# Patient Record
Sex: Female | Born: 1955
Health system: Southern US, Community
[De-identification: ages and names within clinical notes are randomized; demographics above are authoritative.]

## PROBLEM LIST (undated history)

## (undated) DIAGNOSIS — F79 Unspecified intellectual disabilities: Secondary | ICD-10-CM

## (undated) DIAGNOSIS — I5032 Chronic diastolic (congestive) heart failure: Secondary | ICD-10-CM

## (undated) DIAGNOSIS — I10 Essential (primary) hypertension: Secondary | ICD-10-CM

## (undated) DIAGNOSIS — N183 Chronic kidney disease, stage 3 unspecified: Secondary | ICD-10-CM

## (undated) DIAGNOSIS — I2510 Atherosclerotic heart disease of native coronary artery without angina pectoris: Secondary | ICD-10-CM

## (undated) DIAGNOSIS — I4510 Unspecified right bundle-branch block: Secondary | ICD-10-CM

## (undated) DIAGNOSIS — E785 Hyperlipidemia, unspecified: Secondary | ICD-10-CM

## (undated) HISTORY — DX: Atherosclerotic heart disease of native coronary artery without angina pectoris: I25.10

## (undated) HISTORY — DX: Chronic kidney disease, stage 3 unspecified: N18.30

## (undated) HISTORY — DX: Unspecified right bundle-branch block: I45.10

## (undated) HISTORY — DX: Morbid (severe) obesity due to excess calories: E66.01

## (undated) HISTORY — DX: Chronic diastolic (congestive) heart failure: I50.32

---

## 1998-03-15 ENCOUNTER — Emergency Department (HOSPITAL_COMMUNITY): Admission: EM | Admit: 1998-03-15 | Discharge: 1998-03-15 | Payer: Self-pay | Admitting: Emergency Medicine

## 1999-02-25 ENCOUNTER — Encounter: Payer: Self-pay | Admitting: *Deleted

## 1999-02-25 ENCOUNTER — Emergency Department (HOSPITAL_COMMUNITY): Admission: EM | Admit: 1999-02-25 | Discharge: 1999-02-25 | Payer: Self-pay | Admitting: *Deleted

## 1999-04-28 ENCOUNTER — Encounter: Admission: RE | Admit: 1999-04-28 | Discharge: 1999-07-06 | Payer: Self-pay | Admitting: Orthopedic Surgery

## 2000-08-30 ENCOUNTER — Emergency Department (HOSPITAL_COMMUNITY): Admission: EM | Admit: 2000-08-30 | Discharge: 2000-08-30 | Payer: Self-pay | Admitting: Emergency Medicine

## 2001-06-27 ENCOUNTER — Encounter: Admission: RE | Admit: 2001-06-27 | Discharge: 2001-06-27 | Payer: Self-pay | Admitting: Obstetrics & Gynecology

## 2001-06-27 ENCOUNTER — Other Ambulatory Visit: Admission: RE | Admit: 2001-06-27 | Discharge: 2001-06-27 | Payer: Self-pay | Admitting: Obstetrics & Gynecology

## 2001-08-08 ENCOUNTER — Encounter: Admission: RE | Admit: 2001-08-08 | Discharge: 2001-08-08 | Payer: Self-pay | Admitting: Obstetrics & Gynecology

## 2002-10-17 ENCOUNTER — Ambulatory Visit (HOSPITAL_COMMUNITY): Admission: RE | Admit: 2002-10-17 | Discharge: 2002-10-17 | Payer: Self-pay | Admitting: *Deleted

## 2002-10-24 ENCOUNTER — Encounter: Payer: Self-pay | Admitting: *Deleted

## 2002-10-24 ENCOUNTER — Encounter: Admission: RE | Admit: 2002-10-24 | Discharge: 2002-10-24 | Payer: Self-pay | Admitting: *Deleted

## 2003-10-29 ENCOUNTER — Encounter: Admission: RE | Admit: 2003-10-29 | Discharge: 2003-10-29 | Payer: Self-pay | Admitting: *Deleted

## 2007-07-20 ENCOUNTER — Emergency Department (HOSPITAL_COMMUNITY): Admission: EM | Admit: 2007-07-20 | Discharge: 2007-07-21 | Payer: Self-pay | Admitting: Emergency Medicine

## 2009-03-14 ENCOUNTER — Encounter: Admission: RE | Admit: 2009-03-14 | Discharge: 2009-03-14 | Payer: Self-pay | Admitting: Internal Medicine

## 2009-03-21 ENCOUNTER — Encounter: Admission: RE | Admit: 2009-03-21 | Discharge: 2009-03-21 | Payer: Self-pay | Admitting: Internal Medicine

## 2010-01-01 ENCOUNTER — Emergency Department (HOSPITAL_COMMUNITY): Admission: EM | Admit: 2010-01-01 | Discharge: 2010-01-01 | Payer: Self-pay | Admitting: Family Medicine

## 2010-05-15 ENCOUNTER — Ambulatory Visit (HOSPITAL_COMMUNITY): Admission: RE | Admit: 2010-05-15 | Discharge: 2010-05-15 | Payer: Self-pay | Admitting: Family Medicine

## 2011-01-03 ENCOUNTER — Encounter: Payer: Self-pay | Admitting: Internal Medicine

## 2013-02-21 ENCOUNTER — Other Ambulatory Visit: Payer: Self-pay | Admitting: Otolaryngology

## 2013-02-21 DIAGNOSIS — H6980 Other specified disorders of Eustachian tube, unspecified ear: Secondary | ICD-10-CM

## 2013-03-13 ENCOUNTER — Ambulatory Visit
Admission: RE | Admit: 2013-03-13 | Discharge: 2013-03-13 | Disposition: A | Discharge status: Home / Self Care | Payer: Medicare PPO | Source: Ambulatory Visit | Attending: Otolaryngology | Admitting: Otolaryngology

## 2013-03-13 DIAGNOSIS — H6980 Other specified disorders of Eustachian tube, unspecified ear: Secondary | ICD-10-CM

## 2013-03-13 DIAGNOSIS — H6690 Otitis media, unspecified, unspecified ear: Secondary | ICD-10-CM

## 2013-03-13 DIAGNOSIS — H9190 Unspecified hearing loss, unspecified ear: Secondary | ICD-10-CM

## 2013-11-13 IMAGING — CT CT TEMPORAL BONES W/O CM
4 of 6 series · 18 of 30 positions shown, 19 images · non-contrast
Comparison: None.

CLINICAL DATA: Right-sided tinnitus, with itching and burning of
the right ear.

CT TEMPORAL BONES WITHOUT CONTRAST
TECHNIQUE: Axial and coronal plane CT imaging of the petrous
temporal bones was performed with thin-collimation image
reconstruction.  No intravenous contrast was administered.
Multiplanar CT image reconstructions were also generated.

[Series 4: ax mag left · axial · 0.20mm/px · z∈[-18,+5]mm · 4 of 127 slices shown]
[im 26/127  bone]
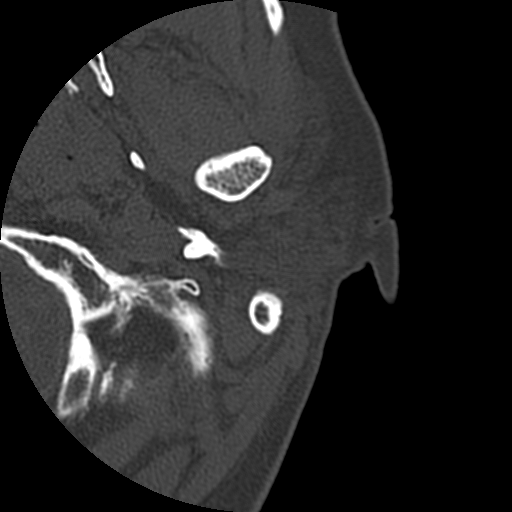
[im 51/127  bone]
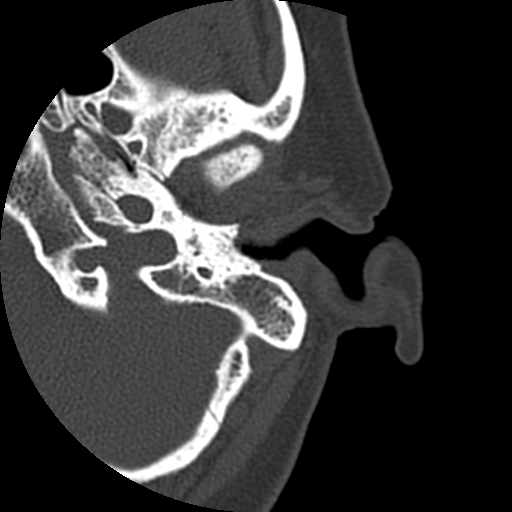
[im 76/127  bone]
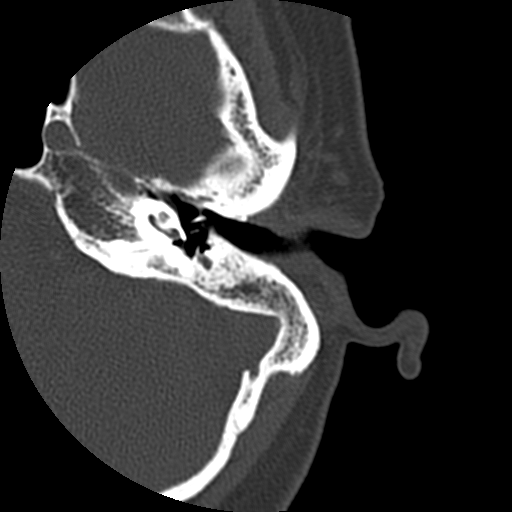
[im 101/127  bone]
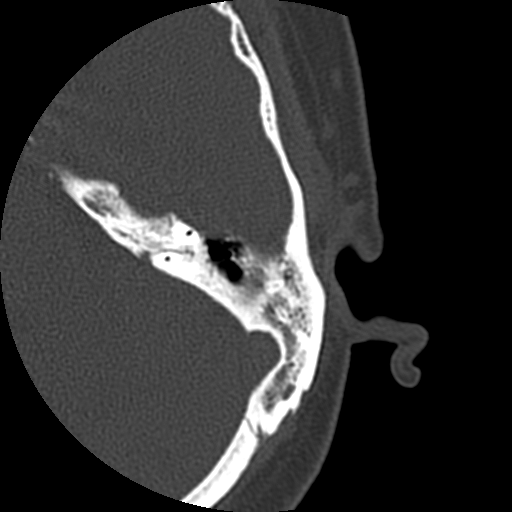

[Series 200: cor bone · axial · 0.33mm/px · z∈[-6,+79]mm · 3 of 70 slices shown, 4 images]
[im 1/70  brain]
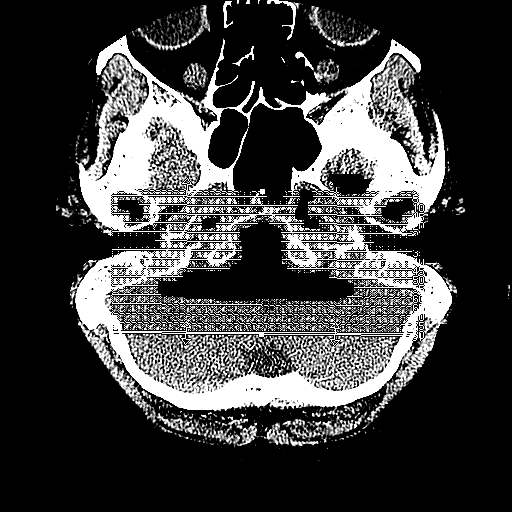
[im 1/70  bone]
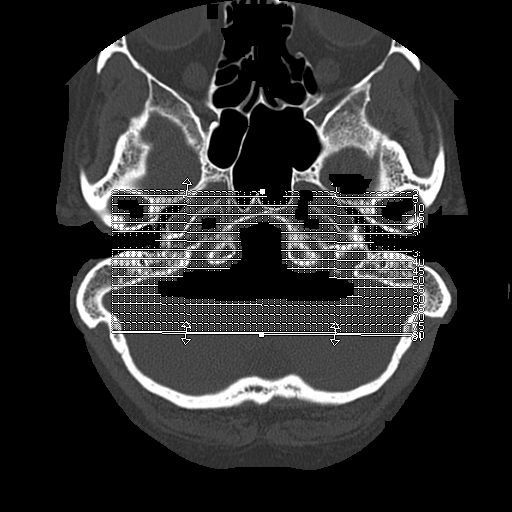
[im 35/70  bone]
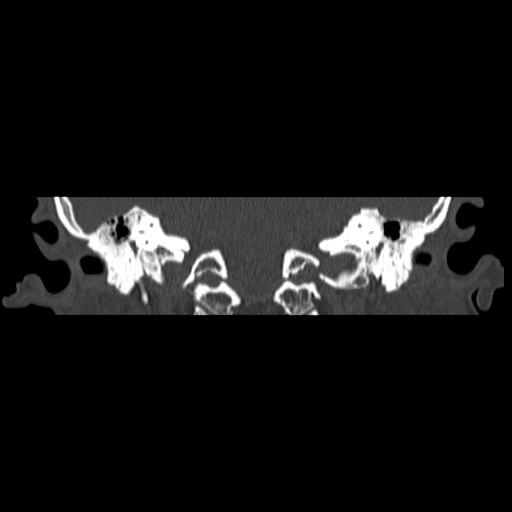
[im 70/70  bone]
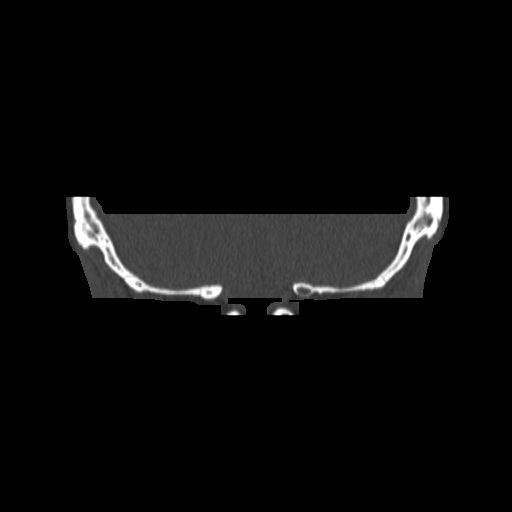

[Series 300: cor mag right · coronal · 0.20mm/px · 6 of 177 slices shown]
[im 26/177  bone]
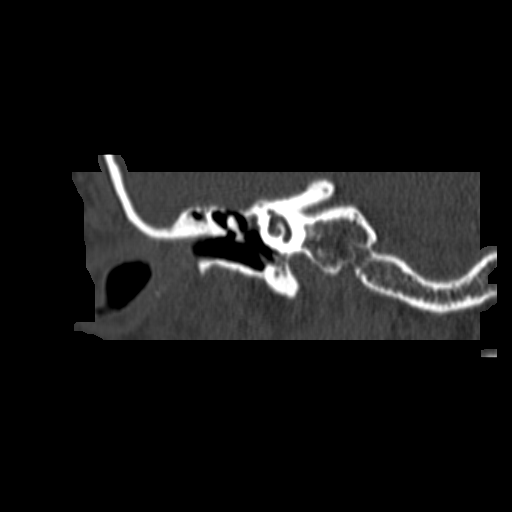
[im 51/177  bone]
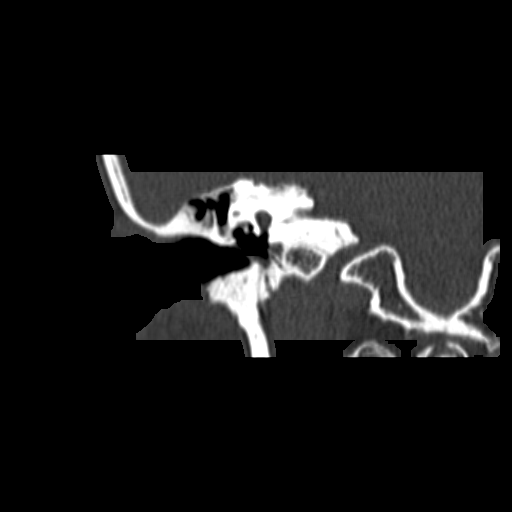
[im 76/177  bone]
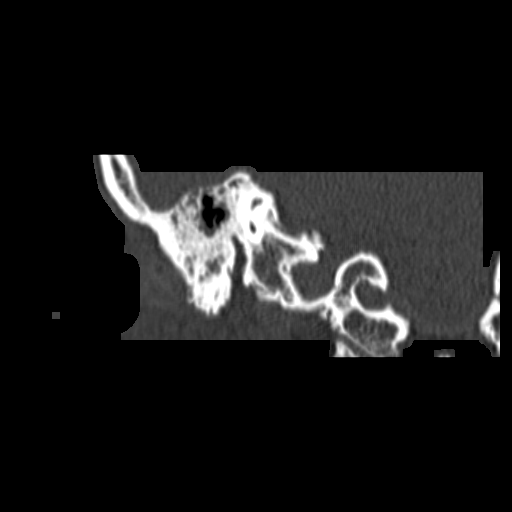
[im 101/177  bone]
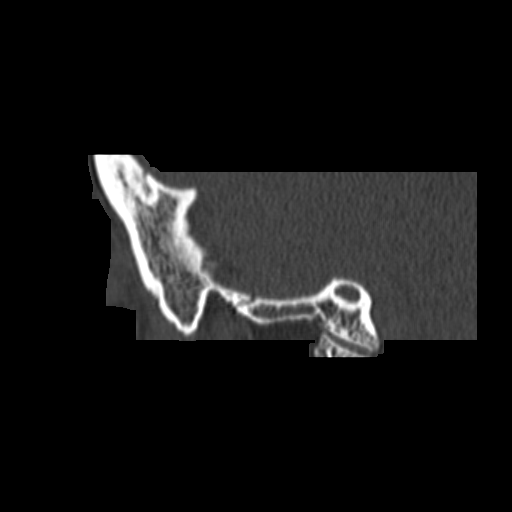
[im 126/177  bone]
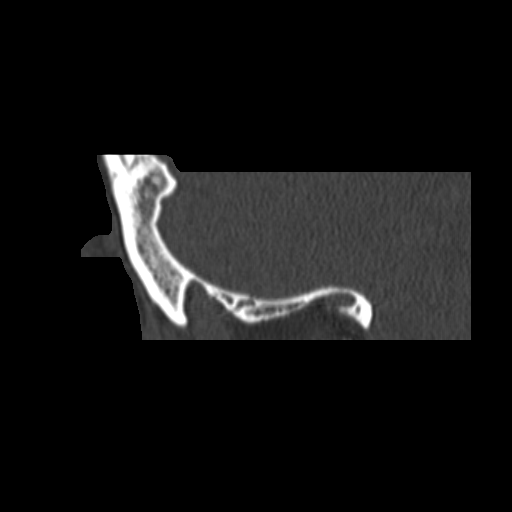
[im 151/177  bone]
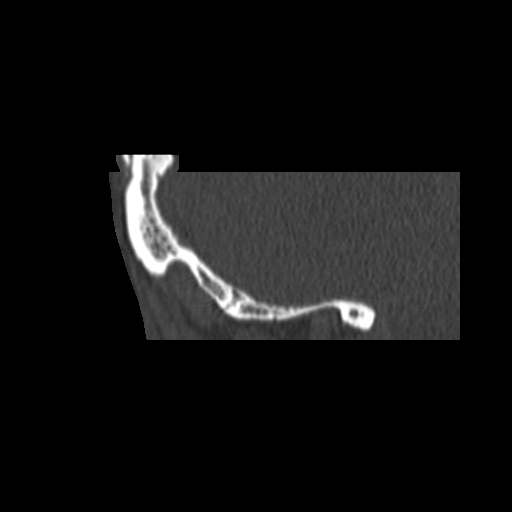

[Series 400: cor mag left · coronal · 0.20mm/px · 5 of 149 slices shown]
[im 25/149  bone]
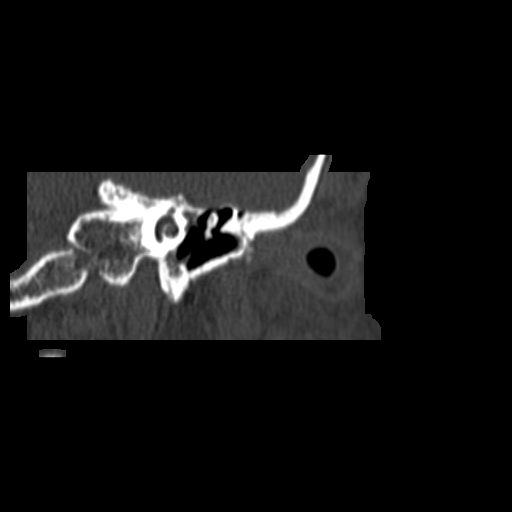
[im 50/149  bone]
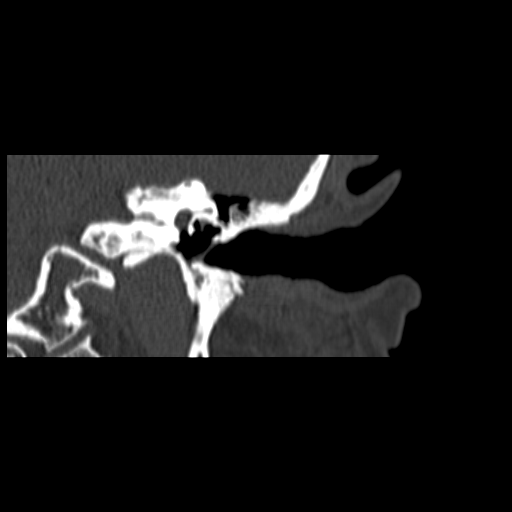
[im 75/149  bone]
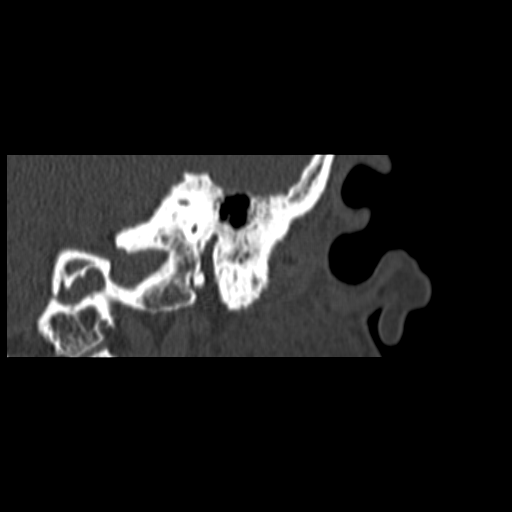
[im 99/149  bone]
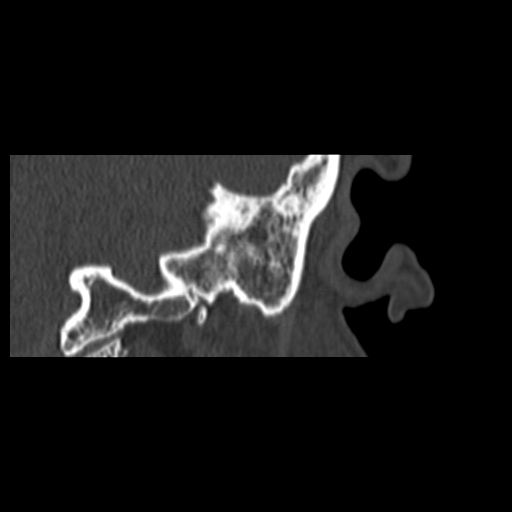
[im 124/149  bone]
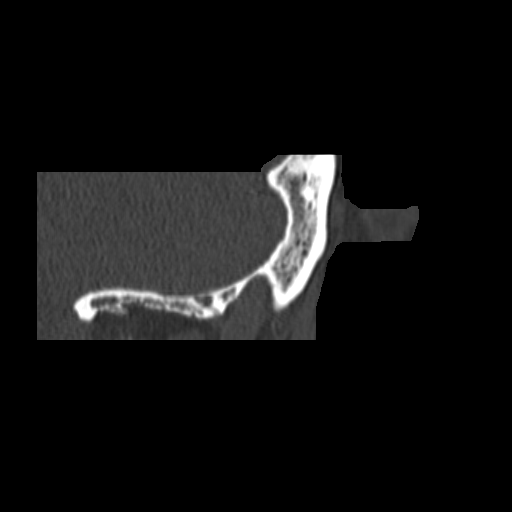

[18 of 30 positions shown; findings below may reference images not displayed]

FINDINGS: Both external canals are widely patent.  Normal tympanic
membranes bilaterally.  Normal right and left ossicles. No middle
ear fluid or mass.

Both mastoids are poorly aerated likely on a congenital basis.
Slight dependent fluid in the right and left mastoid aditus ad
antrum without coalescence or bony destruction.  Normal cochlea,
vestibule, semicircular canals, and internal auditory canals.
Visualized intracranial compartment unremarkable.  No visible
paranasal sinus disease.  Grossly negative posterior orbits.
IMPRESSION: Congenital hypoaeration of both mastoids.  No significant right or
left sided external, middle, or inner ear pathology.

## 2015-05-26 DIAGNOSIS — I10 Essential (primary) hypertension: Secondary | ICD-10-CM | POA: Diagnosis not present

## 2015-05-26 DIAGNOSIS — E785 Hyperlipidemia, unspecified: Secondary | ICD-10-CM | POA: Diagnosis not present

## 2015-08-25 DIAGNOSIS — I10 Essential (primary) hypertension: Secondary | ICD-10-CM | POA: Diagnosis not present

## 2015-08-25 DIAGNOSIS — F17200 Nicotine dependence, unspecified, uncomplicated: Secondary | ICD-10-CM | POA: Diagnosis not present

## 2015-12-09 DIAGNOSIS — Z23 Encounter for immunization: Secondary | ICD-10-CM | POA: Diagnosis not present

## 2015-12-09 DIAGNOSIS — H9311 Tinnitus, right ear: Secondary | ICD-10-CM | POA: Diagnosis not present

## 2015-12-09 DIAGNOSIS — I10 Essential (primary) hypertension: Secondary | ICD-10-CM | POA: Diagnosis not present

## 2015-12-09 DIAGNOSIS — Z6831 Body mass index (BMI) 31.0-31.9, adult: Secondary | ICD-10-CM | POA: Diagnosis not present

## 2015-12-09 DIAGNOSIS — H9319 Tinnitus, unspecified ear: Secondary | ICD-10-CM | POA: Diagnosis not present

## 2016-06-23 DIAGNOSIS — I10 Essential (primary) hypertension: Secondary | ICD-10-CM | POA: Diagnosis not present

## 2016-06-23 DIAGNOSIS — Z0000 Encounter for general adult medical examination without abnormal findings: Secondary | ICD-10-CM | POA: Diagnosis not present

## 2016-06-23 DIAGNOSIS — E785 Hyperlipidemia, unspecified: Secondary | ICD-10-CM | POA: Diagnosis not present

## 2016-06-23 DIAGNOSIS — N390 Urinary tract infection, site not specified: Secondary | ICD-10-CM | POA: Diagnosis not present

## 2016-10-27 DIAGNOSIS — E785 Hyperlipidemia, unspecified: Secondary | ICD-10-CM | POA: Diagnosis not present

## 2016-10-27 DIAGNOSIS — M1990 Unspecified osteoarthritis, unspecified site: Secondary | ICD-10-CM | POA: Diagnosis not present

## 2016-10-27 DIAGNOSIS — Z23 Encounter for immunization: Secondary | ICD-10-CM | POA: Diagnosis not present

## 2016-10-27 DIAGNOSIS — I10 Essential (primary) hypertension: Secondary | ICD-10-CM | POA: Diagnosis not present

## 2017-01-14 DIAGNOSIS — S30820A Blister (nonthermal) of lower back and pelvis, initial encounter: Secondary | ICD-10-CM | POA: Diagnosis not present

## 2017-02-21 DIAGNOSIS — Z0000 Encounter for general adult medical examination without abnormal findings: Secondary | ICD-10-CM | POA: Diagnosis not present

## 2017-02-21 DIAGNOSIS — I10 Essential (primary) hypertension: Secondary | ICD-10-CM | POA: Diagnosis not present

## 2017-02-21 DIAGNOSIS — F17210 Nicotine dependence, cigarettes, uncomplicated: Secondary | ICD-10-CM | POA: Diagnosis not present

## 2017-05-25 DIAGNOSIS — I10 Essential (primary) hypertension: Secondary | ICD-10-CM | POA: Diagnosis not present

## 2017-05-25 DIAGNOSIS — R05 Cough: Secondary | ICD-10-CM | POA: Diagnosis not present

## 2017-05-25 DIAGNOSIS — E785 Hyperlipidemia, unspecified: Secondary | ICD-10-CM | POA: Diagnosis not present

## 2017-05-25 DIAGNOSIS — Z634 Disappearance and death of family member: Secondary | ICD-10-CM | POA: Diagnosis not present

## 2017-10-31 ENCOUNTER — Other Ambulatory Visit (HOSPITAL_COMMUNITY)
Admission: RE | Admit: 2017-10-31 | Discharge: 2017-10-31 | Disposition: A | Discharge status: Home / Self Care | Payer: Medicare HMO | Source: Ambulatory Visit | Attending: Family Medicine | Admitting: Family Medicine

## 2017-10-31 ENCOUNTER — Other Ambulatory Visit: Payer: Self-pay | Admitting: Family Medicine

## 2017-10-31 DIAGNOSIS — N760 Acute vaginitis: Secondary | ICD-10-CM | POA: Diagnosis not present

## 2017-10-31 DIAGNOSIS — I10 Essential (primary) hypertension: Secondary | ICD-10-CM | POA: Diagnosis not present

## 2017-10-31 DIAGNOSIS — Z23 Encounter for immunization: Secondary | ICD-10-CM | POA: Diagnosis not present

## 2017-10-31 DIAGNOSIS — E785 Hyperlipidemia, unspecified: Secondary | ICD-10-CM | POA: Diagnosis not present

## 2017-10-31 DIAGNOSIS — Z720 Tobacco use: Secondary | ICD-10-CM | POA: Diagnosis not present

## 2017-11-02 LAB — URINE CYTOLOGY ANCILLARY ONLY
Bacterial vaginitis: NEGATIVE
CANDIDA VAGINITIS: NEGATIVE
CHLAMYDIA, DNA PROBE: NEGATIVE
Neisseria Gonorrhea: NEGATIVE
Trichomonas: NEGATIVE

## 2017-11-09 DIAGNOSIS — I10 Essential (primary) hypertension: Secondary | ICD-10-CM | POA: Diagnosis not present

## 2017-11-09 DIAGNOSIS — F17200 Nicotine dependence, unspecified, uncomplicated: Secondary | ICD-10-CM | POA: Diagnosis not present

## 2017-11-09 DIAGNOSIS — F320 Major depressive disorder, single episode, mild: Secondary | ICD-10-CM | POA: Diagnosis not present

## 2017-11-09 DIAGNOSIS — F70 Mild intellectual disabilities: Secondary | ICD-10-CM | POA: Diagnosis not present

## 2018-03-01 DIAGNOSIS — R140 Abdominal distension (gaseous): Secondary | ICD-10-CM | POA: Diagnosis not present

## 2018-03-01 DIAGNOSIS — R1084 Generalized abdominal pain: Secondary | ICD-10-CM | POA: Diagnosis not present

## 2018-03-02 DIAGNOSIS — R079 Chest pain, unspecified: Secondary | ICD-10-CM | POA: Diagnosis not present

## 2018-03-02 DIAGNOSIS — R140 Abdominal distension (gaseous): Secondary | ICD-10-CM | POA: Diagnosis not present

## 2018-04-12 DIAGNOSIS — I214 Non-ST elevation (NSTEMI) myocardial infarction: Secondary | ICD-10-CM

## 2018-04-12 HISTORY — DX: Non-ST elevation (NSTEMI) myocardial infarction: I21.4

## 2018-04-12 HISTORY — PX: CORONARY ANGIOPLASTY WITH STENT PLACEMENT: SHX49

## 2018-04-17 DIAGNOSIS — R61 Generalized hyperhidrosis: Secondary | ICD-10-CM | POA: Diagnosis not present

## 2018-04-17 DIAGNOSIS — I10 Essential (primary) hypertension: Secondary | ICD-10-CM | POA: Diagnosis not present

## 2018-04-17 DIAGNOSIS — I25118 Atherosclerotic heart disease of native coronary artery with other forms of angina pectoris: Secondary | ICD-10-CM | POA: Diagnosis not present

## 2018-04-17 DIAGNOSIS — Z7901 Long term (current) use of anticoagulants: Secondary | ICD-10-CM | POA: Diagnosis not present

## 2018-04-17 DIAGNOSIS — Z7401 Bed confinement status: Secondary | ICD-10-CM | POA: Diagnosis not present

## 2018-04-17 DIAGNOSIS — I2510 Atherosclerotic heart disease of native coronary artery without angina pectoris: Secondary | ICD-10-CM | POA: Diagnosis not present

## 2018-04-17 DIAGNOSIS — E785 Hyperlipidemia, unspecified: Secondary | ICD-10-CM | POA: Diagnosis not present

## 2018-04-17 DIAGNOSIS — R748 Abnormal levels of other serum enzymes: Secondary | ICD-10-CM | POA: Diagnosis not present

## 2018-04-17 DIAGNOSIS — R0602 Shortness of breath: Secondary | ICD-10-CM | POA: Diagnosis not present

## 2018-04-17 DIAGNOSIS — Z79899 Other long term (current) drug therapy: Secondary | ICD-10-CM | POA: Diagnosis not present

## 2018-04-17 DIAGNOSIS — R079 Chest pain, unspecified: Secondary | ICD-10-CM | POA: Diagnosis not present

## 2018-04-17 DIAGNOSIS — R112 Nausea with vomiting, unspecified: Secondary | ICD-10-CM | POA: Diagnosis not present

## 2018-04-17 DIAGNOSIS — E7849 Other hyperlipidemia: Secondary | ICD-10-CM | POA: Diagnosis not present

## 2018-04-17 DIAGNOSIS — D72829 Elevated white blood cell count, unspecified: Secondary | ICD-10-CM | POA: Diagnosis not present

## 2018-04-17 DIAGNOSIS — R0789 Other chest pain: Secondary | ICD-10-CM | POA: Diagnosis not present

## 2018-04-17 DIAGNOSIS — F0390 Unspecified dementia without behavioral disturbance: Secondary | ICD-10-CM | POA: Diagnosis not present

## 2018-04-17 DIAGNOSIS — I2119 ST elevation (STEMI) myocardial infarction involving other coronary artery of inferior wall: Secondary | ICD-10-CM | POA: Diagnosis not present

## 2018-04-17 DIAGNOSIS — M2550 Pain in unspecified joint: Secondary | ICD-10-CM | POA: Diagnosis not present

## 2018-04-17 DIAGNOSIS — I214 Non-ST elevation (NSTEMI) myocardial infarction: Secondary | ICD-10-CM | POA: Diagnosis not present

## 2018-04-17 DIAGNOSIS — I4510 Unspecified right bundle-branch block: Secondary | ICD-10-CM | POA: Diagnosis not present

## 2018-04-17 DIAGNOSIS — Z7982 Long term (current) use of aspirin: Secondary | ICD-10-CM | POA: Diagnosis not present

## 2018-04-26 DIAGNOSIS — F17200 Nicotine dependence, unspecified, uncomplicated: Secondary | ICD-10-CM | POA: Diagnosis not present

## 2018-04-26 DIAGNOSIS — R0789 Other chest pain: Secondary | ICD-10-CM | POA: Diagnosis not present

## 2018-04-26 DIAGNOSIS — I214 Non-ST elevation (NSTEMI) myocardial infarction: Secondary | ICD-10-CM | POA: Diagnosis not present

## 2018-04-26 DIAGNOSIS — E7849 Other hyperlipidemia: Secondary | ICD-10-CM | POA: Diagnosis not present

## 2018-06-28 DIAGNOSIS — I214 Non-ST elevation (NSTEMI) myocardial infarction: Secondary | ICD-10-CM | POA: Diagnosis not present

## 2018-06-28 DIAGNOSIS — K089 Disorder of teeth and supporting structures, unspecified: Secondary | ICD-10-CM | POA: Diagnosis not present

## 2018-06-30 DIAGNOSIS — E782 Mixed hyperlipidemia: Secondary | ICD-10-CM | POA: Diagnosis not present

## 2018-06-30 DIAGNOSIS — I4510 Unspecified right bundle-branch block: Secondary | ICD-10-CM | POA: Diagnosis not present

## 2018-06-30 DIAGNOSIS — E785 Hyperlipidemia, unspecified: Secondary | ICD-10-CM | POA: Diagnosis not present

## 2018-06-30 DIAGNOSIS — Z7982 Long term (current) use of aspirin: Secondary | ICD-10-CM | POA: Diagnosis not present

## 2018-06-30 DIAGNOSIS — F0280 Dementia in other diseases classified elsewhere without behavioral disturbance: Secondary | ICD-10-CM | POA: Diagnosis not present

## 2018-06-30 DIAGNOSIS — G301 Alzheimer's disease with late onset: Secondary | ICD-10-CM | POA: Diagnosis not present

## 2018-06-30 DIAGNOSIS — I493 Ventricular premature depolarization: Secondary | ICD-10-CM | POA: Diagnosis not present

## 2018-06-30 DIAGNOSIS — I10 Essential (primary) hypertension: Secondary | ICD-10-CM | POA: Diagnosis not present

## 2018-06-30 DIAGNOSIS — I214 Non-ST elevation (NSTEMI) myocardial infarction: Secondary | ICD-10-CM | POA: Diagnosis not present

## 2018-06-30 DIAGNOSIS — I25768 Atherosclerosis of bypass graft of coronary artery of transplanted heart with other forms of angina pectoris: Secondary | ICD-10-CM | POA: Diagnosis not present

## 2018-07-02 DIAGNOSIS — I493 Ventricular premature depolarization: Secondary | ICD-10-CM | POA: Diagnosis not present

## 2018-07-02 DIAGNOSIS — I4510 Unspecified right bundle-branch block: Secondary | ICD-10-CM | POA: Diagnosis not present

## 2018-07-03 DIAGNOSIS — F70 Mild intellectual disabilities: Secondary | ICD-10-CM | POA: Diagnosis not present

## 2018-07-03 DIAGNOSIS — M6281 Muscle weakness (generalized): Secondary | ICD-10-CM | POA: Diagnosis not present

## 2018-07-03 DIAGNOSIS — I10 Essential (primary) hypertension: Secondary | ICD-10-CM | POA: Diagnosis not present

## 2018-07-10 DIAGNOSIS — M6281 Muscle weakness (generalized): Secondary | ICD-10-CM | POA: Diagnosis not present

## 2018-07-10 DIAGNOSIS — F70 Mild intellectual disabilities: Secondary | ICD-10-CM | POA: Diagnosis not present

## 2018-07-10 DIAGNOSIS — I10 Essential (primary) hypertension: Secondary | ICD-10-CM | POA: Diagnosis not present

## 2018-07-12 DIAGNOSIS — M6281 Muscle weakness (generalized): Secondary | ICD-10-CM | POA: Diagnosis not present

## 2018-07-12 DIAGNOSIS — I10 Essential (primary) hypertension: Secondary | ICD-10-CM | POA: Diagnosis not present

## 2018-07-12 DIAGNOSIS — F70 Mild intellectual disabilities: Secondary | ICD-10-CM | POA: Diagnosis not present

## 2018-07-18 DIAGNOSIS — I10 Essential (primary) hypertension: Secondary | ICD-10-CM | POA: Diagnosis not present

## 2018-07-18 DIAGNOSIS — F70 Mild intellectual disabilities: Secondary | ICD-10-CM | POA: Diagnosis not present

## 2018-07-18 DIAGNOSIS — M6281 Muscle weakness (generalized): Secondary | ICD-10-CM | POA: Diagnosis not present

## 2018-07-20 DIAGNOSIS — F70 Mild intellectual disabilities: Secondary | ICD-10-CM | POA: Diagnosis not present

## 2018-07-20 DIAGNOSIS — M6281 Muscle weakness (generalized): Secondary | ICD-10-CM | POA: Diagnosis not present

## 2018-07-20 DIAGNOSIS — I10 Essential (primary) hypertension: Secondary | ICD-10-CM | POA: Diagnosis not present

## 2018-07-24 DIAGNOSIS — I10 Essential (primary) hypertension: Secondary | ICD-10-CM | POA: Diagnosis not present

## 2018-07-24 DIAGNOSIS — M6281 Muscle weakness (generalized): Secondary | ICD-10-CM | POA: Diagnosis not present

## 2018-07-24 DIAGNOSIS — F70 Mild intellectual disabilities: Secondary | ICD-10-CM | POA: Diagnosis not present

## 2018-07-27 DIAGNOSIS — M6281 Muscle weakness (generalized): Secondary | ICD-10-CM | POA: Diagnosis not present

## 2018-07-27 DIAGNOSIS — I10 Essential (primary) hypertension: Secondary | ICD-10-CM | POA: Diagnosis not present

## 2018-07-27 DIAGNOSIS — F70 Mild intellectual disabilities: Secondary | ICD-10-CM | POA: Diagnosis not present

## 2018-07-31 DIAGNOSIS — I10 Essential (primary) hypertension: Secondary | ICD-10-CM | POA: Diagnosis not present

## 2018-07-31 DIAGNOSIS — F70 Mild intellectual disabilities: Secondary | ICD-10-CM | POA: Diagnosis not present

## 2018-07-31 DIAGNOSIS — M6281 Muscle weakness (generalized): Secondary | ICD-10-CM | POA: Diagnosis not present

## 2018-08-02 DIAGNOSIS — F70 Mild intellectual disabilities: Secondary | ICD-10-CM | POA: Diagnosis not present

## 2018-08-02 DIAGNOSIS — I10 Essential (primary) hypertension: Secondary | ICD-10-CM | POA: Diagnosis not present

## 2018-08-02 DIAGNOSIS — M6281 Muscle weakness (generalized): Secondary | ICD-10-CM | POA: Diagnosis not present

## 2018-08-09 DIAGNOSIS — F70 Mild intellectual disabilities: Secondary | ICD-10-CM | POA: Diagnosis not present

## 2018-08-09 DIAGNOSIS — I10 Essential (primary) hypertension: Secondary | ICD-10-CM | POA: Diagnosis not present

## 2018-08-09 DIAGNOSIS — M6281 Muscle weakness (generalized): Secondary | ICD-10-CM | POA: Diagnosis not present

## 2018-08-10 DIAGNOSIS — F70 Mild intellectual disabilities: Secondary | ICD-10-CM | POA: Diagnosis not present

## 2018-08-10 DIAGNOSIS — I10 Essential (primary) hypertension: Secondary | ICD-10-CM | POA: Diagnosis not present

## 2018-08-10 DIAGNOSIS — M6281 Muscle weakness (generalized): Secondary | ICD-10-CM | POA: Diagnosis not present

## 2018-08-15 DIAGNOSIS — M6281 Muscle weakness (generalized): Secondary | ICD-10-CM | POA: Diagnosis not present

## 2018-08-15 DIAGNOSIS — F70 Mild intellectual disabilities: Secondary | ICD-10-CM | POA: Diagnosis not present

## 2018-08-15 DIAGNOSIS — I10 Essential (primary) hypertension: Secondary | ICD-10-CM | POA: Diagnosis not present

## 2018-08-17 DIAGNOSIS — M6281 Muscle weakness (generalized): Secondary | ICD-10-CM | POA: Diagnosis not present

## 2018-08-17 DIAGNOSIS — F70 Mild intellectual disabilities: Secondary | ICD-10-CM | POA: Diagnosis not present

## 2018-08-17 DIAGNOSIS — I10 Essential (primary) hypertension: Secondary | ICD-10-CM | POA: Diagnosis not present

## 2018-08-24 DIAGNOSIS — F70 Mild intellectual disabilities: Secondary | ICD-10-CM | POA: Diagnosis not present

## 2018-08-24 DIAGNOSIS — I10 Essential (primary) hypertension: Secondary | ICD-10-CM | POA: Diagnosis not present

## 2018-08-24 DIAGNOSIS — M6281 Muscle weakness (generalized): Secondary | ICD-10-CM | POA: Diagnosis not present

## 2018-08-25 DIAGNOSIS — M6281 Muscle weakness (generalized): Secondary | ICD-10-CM | POA: Diagnosis not present

## 2018-08-25 DIAGNOSIS — F70 Mild intellectual disabilities: Secondary | ICD-10-CM | POA: Diagnosis not present

## 2018-08-25 DIAGNOSIS — I10 Essential (primary) hypertension: Secondary | ICD-10-CM | POA: Diagnosis not present

## 2018-08-28 DIAGNOSIS — I10 Essential (primary) hypertension: Secondary | ICD-10-CM | POA: Diagnosis not present

## 2018-08-28 DIAGNOSIS — F70 Mild intellectual disabilities: Secondary | ICD-10-CM | POA: Diagnosis not present

## 2018-08-28 DIAGNOSIS — M6281 Muscle weakness (generalized): Secondary | ICD-10-CM | POA: Diagnosis not present

## 2018-10-25 DIAGNOSIS — L2089 Other atopic dermatitis: Secondary | ICD-10-CM | POA: Diagnosis not present

## 2018-10-25 DIAGNOSIS — Z789 Other specified health status: Secondary | ICD-10-CM | POA: Diagnosis not present

## 2018-10-25 DIAGNOSIS — Z716 Tobacco abuse counseling: Secondary | ICD-10-CM | POA: Diagnosis not present

## 2018-11-01 DIAGNOSIS — G8929 Other chronic pain: Secondary | ICD-10-CM | POA: Diagnosis not present

## 2018-11-01 DIAGNOSIS — Z789 Other specified health status: Secondary | ICD-10-CM | POA: Diagnosis not present

## 2018-11-29 DIAGNOSIS — R69 Illness, unspecified: Secondary | ICD-10-CM | POA: Diagnosis not present

## 2018-12-20 DIAGNOSIS — I10 Essential (primary) hypertension: Secondary | ICD-10-CM | POA: Diagnosis not present

## 2018-12-20 DIAGNOSIS — R05 Cough: Secondary | ICD-10-CM | POA: Diagnosis not present

## 2018-12-21 DIAGNOSIS — R05 Cough: Secondary | ICD-10-CM | POA: Diagnosis not present

## 2018-12-27 DIAGNOSIS — J060 Acute laryngopharyngitis: Secondary | ICD-10-CM | POA: Diagnosis not present

## 2019-01-04 DIAGNOSIS — H2513 Age-related nuclear cataract, bilateral: Secondary | ICD-10-CM | POA: Diagnosis not present

## 2019-03-21 DIAGNOSIS — B86 Scabies: Secondary | ICD-10-CM | POA: Diagnosis not present

## 2019-03-21 DIAGNOSIS — L298 Other pruritus: Secondary | ICD-10-CM | POA: Diagnosis not present

## 2019-04-03 DIAGNOSIS — I10 Essential (primary) hypertension: Secondary | ICD-10-CM | POA: Diagnosis not present

## 2019-04-03 DIAGNOSIS — E785 Hyperlipidemia, unspecified: Secondary | ICD-10-CM | POA: Diagnosis not present

## 2019-04-03 DIAGNOSIS — Z7982 Long term (current) use of aspirin: Secondary | ICD-10-CM | POA: Diagnosis not present

## 2019-04-03 DIAGNOSIS — I214 Non-ST elevation (NSTEMI) myocardial infarction: Secondary | ICD-10-CM | POA: Diagnosis not present

## 2019-04-03 DIAGNOSIS — I2510 Atherosclerotic heart disease of native coronary artery without angina pectoris: Secondary | ICD-10-CM | POA: Diagnosis not present

## 2020-08-26 ENCOUNTER — Emergency Department (HOSPITAL_COMMUNITY): Payer: Medicare HMO

## 2020-08-26 ENCOUNTER — Inpatient Hospital Stay (HOSPITAL_COMMUNITY)
Admission: EM | Admit: 2020-08-26 | Discharge: 2020-09-04 | DRG: 291 | Disposition: A | Discharge status: Home / Self Care | Payer: Medicare HMO | Attending: Family Medicine | Admitting: Family Medicine

## 2020-08-26 ENCOUNTER — Encounter (HOSPITAL_COMMUNITY): Payer: Self-pay

## 2020-08-26 ENCOUNTER — Other Ambulatory Visit: Payer: Self-pay

## 2020-08-26 DIAGNOSIS — E785 Hyperlipidemia, unspecified: Secondary | ICD-10-CM | POA: Diagnosis present

## 2020-08-26 DIAGNOSIS — I160 Hypertensive urgency: Secondary | ICD-10-CM | POA: Diagnosis present

## 2020-08-26 DIAGNOSIS — N1831 Chronic kidney disease, stage 3a: Secondary | ICD-10-CM | POA: Diagnosis present

## 2020-08-26 DIAGNOSIS — J810 Acute pulmonary edema: Secondary | ICD-10-CM

## 2020-08-26 DIAGNOSIS — F0390 Unspecified dementia without behavioral disturbance: Secondary | ICD-10-CM | POA: Diagnosis present

## 2020-08-26 DIAGNOSIS — J9600 Acute respiratory failure, unspecified whether with hypoxia or hypercapnia: Secondary | ICD-10-CM | POA: Diagnosis not present

## 2020-08-26 DIAGNOSIS — R809 Proteinuria, unspecified: Secondary | ICD-10-CM | POA: Diagnosis present

## 2020-08-26 DIAGNOSIS — J9601 Acute respiratory failure with hypoxia: Secondary | ICD-10-CM | POA: Diagnosis present

## 2020-08-26 DIAGNOSIS — I252 Old myocardial infarction: Secondary | ICD-10-CM | POA: Diagnosis not present

## 2020-08-26 DIAGNOSIS — I493 Ventricular premature depolarization: Secondary | ICD-10-CM | POA: Diagnosis present

## 2020-08-26 DIAGNOSIS — I200 Unstable angina: Secondary | ICD-10-CM | POA: Diagnosis present

## 2020-08-26 DIAGNOSIS — J189 Pneumonia, unspecified organism: Secondary | ICD-10-CM | POA: Diagnosis present

## 2020-08-26 DIAGNOSIS — E1122 Type 2 diabetes mellitus with diabetic chronic kidney disease: Secondary | ICD-10-CM | POA: Diagnosis present

## 2020-08-26 DIAGNOSIS — Z955 Presence of coronary angioplasty implant and graft: Secondary | ICD-10-CM | POA: Diagnosis not present

## 2020-08-26 DIAGNOSIS — Z6841 Body Mass Index (BMI) 40.0 and over, adult: Secondary | ICD-10-CM | POA: Diagnosis not present

## 2020-08-26 DIAGNOSIS — E1165 Type 2 diabetes mellitus with hyperglycemia: Secondary | ICD-10-CM | POA: Diagnosis present

## 2020-08-26 DIAGNOSIS — Z20822 Contact with and (suspected) exposure to covid-19: Secondary | ICD-10-CM | POA: Diagnosis present

## 2020-08-26 DIAGNOSIS — Z7984 Long term (current) use of oral hypoglycemic drugs: Secondary | ICD-10-CM

## 2020-08-26 DIAGNOSIS — I5031 Acute diastolic (congestive) heart failure: Secondary | ICD-10-CM | POA: Diagnosis not present

## 2020-08-26 DIAGNOSIS — F329 Major depressive disorder, single episode, unspecified: Secondary | ICD-10-CM | POA: Diagnosis present

## 2020-08-26 DIAGNOSIS — R6250 Unspecified lack of expected normal physiological development in childhood: Secondary | ICD-10-CM | POA: Diagnosis present

## 2020-08-26 DIAGNOSIS — I2510 Atherosclerotic heart disease of native coronary artery without angina pectoris: Secondary | ICD-10-CM | POA: Diagnosis present

## 2020-08-26 DIAGNOSIS — I4510 Unspecified right bundle-branch block: Secondary | ICD-10-CM | POA: Diagnosis present

## 2020-08-26 DIAGNOSIS — I5033 Acute on chronic diastolic (congestive) heart failure: Secondary | ICD-10-CM | POA: Diagnosis present

## 2020-08-26 DIAGNOSIS — F70 Mild intellectual disabilities: Secondary | ICD-10-CM | POA: Diagnosis present

## 2020-08-26 DIAGNOSIS — I130 Hypertensive heart and chronic kidney disease with heart failure and stage 1 through stage 4 chronic kidney disease, or unspecified chronic kidney disease: Principal | ICD-10-CM | POA: Diagnosis present

## 2020-08-26 DIAGNOSIS — I10 Essential (primary) hypertension: Secondary | ICD-10-CM | POA: Diagnosis not present

## 2020-08-26 DIAGNOSIS — Z9981 Dependence on supplemental oxygen: Secondary | ICD-10-CM

## 2020-08-26 DIAGNOSIS — E8779 Other fluid overload: Secondary | ICD-10-CM | POA: Diagnosis not present

## 2020-08-26 DIAGNOSIS — M25551 Pain in right hip: Secondary | ICD-10-CM | POA: Diagnosis not present

## 2020-08-26 DIAGNOSIS — I25118 Atherosclerotic heart disease of native coronary artery with other forms of angina pectoris: Secondary | ICD-10-CM | POA: Diagnosis not present

## 2020-08-26 DIAGNOSIS — I169 Hypertensive crisis, unspecified: Secondary | ICD-10-CM

## 2020-08-26 DIAGNOSIS — R079 Chest pain, unspecified: Secondary | ICD-10-CM | POA: Diagnosis not present

## 2020-08-26 HISTORY — DX: Unspecified intellectual disabilities: F79

## 2020-08-26 HISTORY — DX: Essential (primary) hypertension: I10

## 2020-08-26 HISTORY — DX: Hyperlipidemia, unspecified: E78.5

## 2020-08-26 LAB — CBC WITH DIFFERENTIAL/PLATELET
Abs Immature Granulocytes: 0.04 10*3/uL (ref 0.00–0.07)
Basophils Absolute: 0.1 10*3/uL (ref 0.0–0.1)
Basophils Relative: 1 %
Eosinophils Absolute: 0.3 10*3/uL (ref 0.0–0.5)
Eosinophils Relative: 3 %
HCT: 42.1 % (ref 36.0–46.0)
Hemoglobin: 13.3 g/dL (ref 12.0–15.0)
Immature Granulocytes: 0 %
Lymphocytes Relative: 12 %
Lymphs Abs: 1.2 10*3/uL (ref 0.7–4.0)
MCH: 27.3 pg (ref 26.0–34.0)
MCHC: 31.6 g/dL (ref 30.0–36.0)
MCV: 86.4 fL (ref 80.0–100.0)
Monocytes Absolute: 0.8 10*3/uL (ref 0.1–1.0)
Monocytes Relative: 8 %
Neutro Abs: 7.6 10*3/uL (ref 1.7–7.7)
Neutrophils Relative %: 76 %
Platelets: 238 10*3/uL (ref 150–400)
RBC: 4.87 MIL/uL (ref 3.87–5.11)
RDW: 15.5 % (ref 11.5–15.5)
WBC: 10 10*3/uL (ref 4.0–10.5)
nRBC: 0 % (ref 0.0–0.2)

## 2020-08-26 LAB — TROPONIN I (HIGH SENSITIVITY)
Troponin I (High Sensitivity): 25 ng/L — ABNORMAL HIGH (ref <18)
Troponin I (High Sensitivity): 28 ng/L — ABNORMAL HIGH (ref <18)
Troponin I (High Sensitivity): 24 ng/L — ABNORMAL HIGH (ref <18)
Troponin I (High Sensitivity): 29 ng/L — ABNORMAL HIGH (ref <18)

## 2020-08-26 LAB — COMPREHENSIVE METABOLIC PANEL
ALT: 22 U/L (ref 0–44)
AST: 18 U/L (ref 15–41)
Albumin: 2.8 g/dL — ABNORMAL LOW (ref 3.5–5.0)
Alkaline Phosphatase: 64 U/L (ref 38–126)
Anion gap: 11 (ref 5–15)
BUN: 15 mg/dL (ref 8–23)
CO2: 27 mmol/L (ref 22–32)
Calcium: 8.6 mg/dL — ABNORMAL LOW (ref 8.9–10.3)
Chloride: 98 mmol/L (ref 98–111)
Creatinine, Ser: 1.25 mg/dL — ABNORMAL HIGH (ref 0.44–1.00)
GFR calc Af Amer: 53 mL/min — ABNORMAL LOW (ref >60)
GFR calc non Af Amer: 45 mL/min — ABNORMAL LOW (ref >60)
Glucose, Bld: 217 mg/dL — ABNORMAL HIGH (ref 70–99)
Potassium: 3.9 mmol/L (ref 3.5–5.1)
Sodium: 136 mmol/L (ref 135–145)
Total Bilirubin: 0.5 mg/dL (ref 0.3–1.2)
Total Protein: 6.8 g/dL (ref 6.5–8.1)

## 2020-08-26 LAB — URINALYSIS, ROUTINE W REFLEX MICROSCOPIC
Bilirubin Urine: NEGATIVE
Glucose, UA: 50 mg/dL — AB
Hgb urine dipstick: NEGATIVE
Ketones, ur: NEGATIVE mg/dL
Leukocytes,Ua: NEGATIVE
Nitrite: NEGATIVE
Protein, ur: >=300 mg/dL — AB
Specific Gravity, Urine: 1.009 (ref 1.005–1.030)
pH: 7 (ref 5.0–8.0)

## 2020-08-26 LAB — BRAIN NATRIURETIC PEPTIDE: B Natriuretic Peptide: 193.1 pg/mL — ABNORMAL HIGH (ref 0.0–100.0)

## 2020-08-26 LAB — SARS CORONAVIRUS 2 BY RT PCR (HOSPITAL ORDER, PERFORMED IN ~~LOC~~ HOSPITAL LAB): SARS Coronavirus 2: NEGATIVE

## 2020-08-26 LAB — CBC
HCT: 41.3 % (ref 36.0–46.0)
Hemoglobin: 12.7 g/dL (ref 12.0–15.0)
MCH: 26.5 pg (ref 26.0–34.0)
MCHC: 30.8 g/dL (ref 30.0–36.0)
MCV: 86.2 fL (ref 80.0–100.0)
Platelets: 230 10*3/uL (ref 150–400)
RBC: 4.79 MIL/uL (ref 3.87–5.11)
RDW: 15.1 % (ref 11.5–15.5)
WBC: 10.4 10*3/uL (ref 4.0–10.5)
nRBC: 0 % (ref 0.0–0.2)

## 2020-08-26 LAB — CREATININE, SERUM
Creatinine, Ser: 1.26 mg/dL — ABNORMAL HIGH (ref 0.44–1.00)
GFR calc Af Amer: 52 mL/min — ABNORMAL LOW (ref >60)
GFR calc non Af Amer: 45 mL/min — ABNORMAL LOW (ref >60)

## 2020-08-26 LAB — PROCALCITONIN: Procalcitonin: <0.1 ng/mL

## 2020-08-26 LAB — TSH: TSH: 2.413 u[IU]/mL (ref 0.350–4.500)

## 2020-08-26 LAB — HIV ANTIBODY (ROUTINE TESTING W REFLEX): HIV Screen 4th Generation wRfx: NONREACTIVE

## 2020-08-26 MED ORDER — SODIUM CHLORIDE 0.9 % IV SOLN
500.0000 mg | Freq: Once | INTRAVENOUS | Status: AC
  Administered 2020-08-26: 500 mg via INTRAVENOUS
  Filled 2020-08-26: qty 500

## 2020-08-26 MED ORDER — FUROSEMIDE 10 MG/ML IJ SOLN
40.0000 mg | INTRAMUSCULAR | Status: AC
  Administered 2020-08-26: 40 mg via INTRAVENOUS
  Filled 2020-08-26: qty 4

## 2020-08-26 MED ORDER — NITROGLYCERIN PEDIATRIC IV INFUSION 200 MCG/ML: 5.0000 ug/kg/min | INTRAVENOUS | Status: DC

## 2020-08-26 MED ORDER — FUROSEMIDE 10 MG/ML IJ SOLN: 40.0000 mg | Freq: Two times a day (BID) | INTRAMUSCULAR | Status: DC

## 2020-08-26 MED ORDER — SODIUM CHLORIDE 0.9 % IV SOLN
2.0000 g | INTRAVENOUS | Status: AC
  Administered 2020-08-27 – 2020-08-31 (×5): 2 g via INTRAVENOUS
  Filled 2020-08-26 (×5): qty 2

## 2020-08-26 MED ORDER — ONDANSETRON HCL 4 MG PO TABS: 4.0000 mg | ORAL_TABLET | Freq: Four times a day (QID) | ORAL | Status: DC | PRN

## 2020-08-26 MED ORDER — ONDANSETRON HCL 4 MG/2ML IJ SOLN
4.0000 mg | Freq: Four times a day (QID) | INTRAMUSCULAR | Status: DC | PRN
  Administered 2020-08-26: 4 mg via INTRAVENOUS
  Filled 2020-08-26: qty 2

## 2020-08-26 MED ORDER — NITROGLYCERIN IN D5W 200-5 MCG/ML-% IV SOLN
0.0000 ug/min | INTRAVENOUS | Status: DC
  Administered 2020-08-26: 5 ug/min via INTRAVENOUS
  Filled 2020-08-26: qty 250

## 2020-08-26 MED ORDER — ENOXAPARIN SODIUM 40 MG/0.4ML ~~LOC~~ SOLN
40.0000 mg | SUBCUTANEOUS | Status: DC
  Administered 2020-08-26 – 2020-09-03 (×9): 40 mg via SUBCUTANEOUS
  Filled 2020-08-26 (×9): qty 0.4

## 2020-08-26 MED ORDER — SODIUM CHLORIDE 0.9 % IV SOLN
500.0000 mg | INTRAVENOUS | Status: AC
  Administered 2020-08-27 – 2020-08-31 (×5): 500 mg via INTRAVENOUS
  Filled 2020-08-26 (×5): qty 500

## 2020-08-26 MED ORDER — SODIUM CHLORIDE 0.9 % IV SOLN
1.0000 g | Freq: Once | INTRAVENOUS | Status: AC
  Administered 2020-08-26: 1 g via INTRAVENOUS
  Filled 2020-08-26: qty 10

## 2020-08-26 NOTE — ED Triage Notes (Signed)
Pt bib [facility] from [facility] w/ c/o 6/10 chest pain that started this morning. Pt found to be 91% on RA w/ EMS and placed on 2 lpm o2 via [place] w/ spo2 up to 100%. Pt has hx of HLD, HTN, and intellectual disability. EMS VSS, EMS EKG unremarkable.

## 2020-08-26 NOTE — H&P (Addendum)
History and Physical    Jo Hays FIE:[id number] DOB: 27-Aug-1956 DOA: 08/26/2020  PCP: Patient, No Pcp Per  Patient coming from: Skilled nursing facility.  Chief Complaint: Chest pain.  HPI: RUQAYA STRAUSS is a 64 y.o. female with history of CAD status post stenting in May 2019 for non-ST elevation MI, hypertension dyslipidemia depression and intellectual disability was brought to the ER after patient was complaining of chest pain.  Patient states she has been having chest pain for the last 3 days mostly retrosternal nonradiating with some shortness of breath.  Denies some epigastric discomfort also.  Patient is a poor historian.  ED Course: In the ER patient is found to have markedly elevated blood pressure with systolic blood pressure more than 200 chest x-ray showing infiltrates concerning for pneumonia patient is afebrile labs are significant for EKG showed normal sinus rhythm PVCs high sensitive troponins were 25 and 38 BNP of 193 CBC was unremarkable creatinine 1.2 blood glucose of 217 albumin 2.8.  Patient was given 1 dose of IV Lasix started on nitroglycerin infusion for blood pressure and chest pain with at the time of my exam patient is chest pain-free.  Was also started empirically on antibiotics for possible pneumonia.  Covid test was negative.  Review of Systems: As per HPI, rest all negative.   Past Medical History:  Diagnosis Date  . Hyperlipidemia   . Hypertension   . Intellectual disability     History reviewed. No pertinent surgical history.   reports that she has never smoked. She has quit using smokeless tobacco. No history on file for alcohol use and drug use.  Not on File  Family History  Family history unknown: Yes    Prior to Admission medications   Not on File    Physical Exam: Constitutional: Moderately built and nourished. Vitals:   08/26/20 1945 08/26/20 2000 08/26/20 2005 08/26/20 2035  BP: (!) 188/76 (!) 151/125 130/85 (!) 173/78  Pulse:  65 61 63 62  Resp: 18 17 17 15   Temp:      TempSrc:      SpO2: 94% 92% 93% 92%  Weight:      Height:       Eyes: Anicteric no pallor. ENMT: No discharge from the ears eyes nose or mouth. Neck: No mass felt.  No neck rigidity. Respiratory: No rhonchi or crepitations. Cardiovascular: S1-S2 heard. Abdomen: Soft nontender bowel sounds present. Musculoskeletal: No edema. Skin: No rash. Neurologic: Alert awake oriented to person and place moves all extremities. Psychiatric: Patient has some difficulty recollecting recent past.   Labs on Admission: I have personally reviewed following labs and imaging studies  CBC: Recent Labs  Lab 08/26/20 1725  WBC 10.0  NEUTROABS 7.6  HGB 13.3  HCT 42.1  MCV 86.4  PLT 238   Basic Metabolic Panel: Recent Labs  Lab 08/26/20 1725  NA 136  K 3.9  CL 98  CO2 27  GLUCOSE 217*  BUN 15  CREATININE 1.25*  CALCIUM 8.6*   GFR: Estimated Creatinine Clearance: 54.4 mL/min (A) (by C-G formula based on SCr of 1.25 mg/dL (H)). Liver Function Tests: Recent Labs  Lab 08/26/20 1725  AST 18  ALT 22  ALKPHOS 64  BILITOT 0.5  PROT 6.8  ALBUMIN 2.8*   No results for input(s): LIPASE, AMYLASE in the last 168 hours. No results for input(s): AMMONIA in the last 168 hours. Coagulation Profile: No results for input(s): INR, PROTIME in the last 168 hours. Cardiac Enzymes:  No results for input(s): CKTOTAL, CKMB, CKMBINDEX, TROPONINI in the last 168 hours. BNP (last 3 results) No results for input(s): PROBNP in the last 8760 hours. HbA1C: No results for input(s): HGBA1C in the last 72 hours. CBG: No results for input(s): GLUCAP in the last 168 hours. Lipid Profile: No results for input(s): CHOL, HDL, LDLCALC, TRIG, CHOLHDL, LDLDIRECT in the last 72 hours. Thyroid Function Tests: No results for input(s): TSH, T4TOTAL, FREET4, T3FREE, THYROIDAB in the last 72 hours. Anemia Panel: No results for input(s): VITAMINB12, FOLATE, FERRITIN, TIBC,  IRON, RETICCTPCT in the last 72 hours. Urine analysis: No results found for: COLORURINE, APPEARANCEUR, LABSPEC, PHURINE, GLUCOSEU, HGBUR, BILIRUBINUR, KETONESUR, PROTEINUR, UROBILINOGEN, NITRITE, LEUKOCYTESUR Sepsis Labs: @LABRCNTIP (procalcitonin:4,lacticidven:4) ) Recent Results (from the past 240 hour(s))  SARS Coronavirus 2 by RT PCR (hospital order, performed in [facility] hospital lab) Nasopharyngeal Nasopharyngeal Swab     Status: None   Collection Time: 08/26/20  7:23 PM   Specimen: Nasopharyngeal Swab  Result Value Ref Range Status   SARS Coronavirus 2 NEGATIVE NEGATIVE Final    Comment: (NOTE) SARS-CoV-2 target nucleic acids are NOT DETECTED.  The SARS-CoV-2 RNA is generally detectable in upper and lower respiratory specimens during the acute phase of infection. The lowest concentration of SARS-CoV-2 viral copies this assay can detect is 250 copies / mL. A negative result does not preclude SARS-CoV-2 infection and should not be used as the sole basis for treatment or other patient management decisions.  A negative result may occur with improper specimen collection / handling, submission of specimen other than nasopharyngeal swab, presence of viral mutation(s) within the areas targeted by this assay, and inadequate number of viral copies (<250 copies / mL). A negative result must be combined with clinical observations, patient history, and epidemiological information.  Fact Sheet for Patients:   08/28/20  Fact Sheet for Healthcare Providers: BoilerBrush.com.cy  This test is not yet approved or  cleared by the https://pope.com/ FDA and has been authorized for detection and/or diagnosis of SARS-CoV-2 by FDA under an Emergency Use Authorization (EUA).  This EUA will remain in effect (meaning this test can be used) for the duration of the COVID-19 declaration under Section 564(b)(1) of the Act, 21 U.S.C. section  360bbb-3(b)(1), unless the authorization is terminated or revoked sooner.  Performed at [facility], [address], [place], [place]      Radiological Exams on Admission: DG Chest 2 View  Result Date: 08/26/2020 CLINICAL DATA:  Chest pain, productive cough EXAM: CHEST - 2 VIEW COMPARISON:  04/17/2018 FINDINGS: Lung volumes are small. Focal interstitial infiltrate is seen within the lingula. No pneumothorax or pleural effusion. Cardiac size is within normal limits when accounting for poor pulmonary insufflation. No acute bone abnormality., IMPRESSION: Focal lingular infiltrate, likely infectious in the appropriate clinical setting. Electronically Signed   By: 06/17/2018 MD   On: 08/26/2020 18:04    EKG: Independently reviewed.  Normal sinus rhythm.  Assessment/Plan Principal Problem:   Acute respiratory failure (HCC) Active Problems:   CAD (coronary artery disease)   Hypertensive urgency    1. Unstable angina -given history of CAD status post stenting with the sudden onset of chest pain last few days concerning for unstable angina presently isolated troponin is flat.  Started on nitroglycerin infusion following which patient chest pain is improved blood pressure improved.  Will cycle cardiac markers keep patient n.p.o. except medication consult cardiology. 2. Hypertensive urgency likely contributing to patient's symptoms which patient is  on nitroglycerin infusion will continue patient's home dose of lisinopril beta-blockers and closely follow blood pressure trends. 3. Possible pneumonia -patient does not have any fever or WBC count.  If procalcitonin is negative will discontinue antibiotics. 4. Hyperglycemia concerning for new onset diabetes mellitus for which we will check hemoglobin A1c check CBGs closely. 5. History of depression we will continue home medications. 6. Possible chronic kidney disease stage III -creatinine has increased from the 1 in Care Everywhere  available in 2019.Marland Kitchen  Not know of the creatinine of recent past.  Given that patient has proteinuria patient likely has chronic kidney disease.  Follow metabolic panel closely.  Given that patient has unstable angina with history of CAD and hypertensive urgency will need close monitoring for any further worsening in inpatient status.   DVT prophylaxis: Lovenox. Code Status: Full code. Family Communication: We will need to discuss with patient's family. Disposition Plan: Back to facility when stable. Consults called: Cardiology. Admission status: Inpatient.   Eduard Clos MD [facility] Pager [phone].  If 7PM-7AM, please contact night-coverage [email or web address] Password [facility]  08/26/2020, 8:59 PM

## 2020-08-26 NOTE — ED Notes (Addendum)
Pt continues to call out for bedpan. Not much output. RN notified MD.

## 2020-08-26 NOTE — ED Notes (Signed)
Bladder Scan 300ml 

## 2020-08-26 NOTE — ED Notes (Signed)
Patient O2 baseline on RA was 86-88%. Pt placed on 2lpm per PA order.

## 2020-08-26 NOTE — ED Provider Notes (Signed)
Medical screening examination/treatment/procedure(s) were conducted as a shared visit with non-physician practitioner(s) and myself.  I personally evaluated the patient during the encounter.  Clinical Impression:   Final diagnoses:  Acute pulmonary edema (HCC)  Hypertensive crisis    This patient is a pleasant 64 year old female with a known history of congestive heart failure, prior history of ischemic heart disease status post stenting currently taking Brilinta.  She presents to the hospital with increasing swelling of her legs increasing shortness of breath with a new oxygen requirement.  She has subtle rales is mildly tachypneic and has bilateral peripheral edema which is symmetrical and pitting to the knees.  She is severely hypertensive at 211/85.  We will start nitroglycerin drip, supplemental oxygen, chest x-ray shows edema, will need to be admitted for diuresis and ischemic work-up.  Patient agreeable, the patient is critically ill with acute pulmonary edema in the presence of severe hypertension.  She has known ischemic heart disease  Needs abx for lingular infiltrate with cough and yellow sputum.  .Critical Care Performed by: Eber Hong, MD Authorized by: Eber Hong, MD   Critical care provider statement:    Critical care time (minutes):  35   Critical care time was exclusive of:  Separately billable procedures and treating other patients and teaching time   Critical care was necessary to treat or prevent imminent or life-threatening deterioration of the following conditions:  Cardiac failure   Critical care was time spent personally by me on the following activities:  Blood draw for specimens, development of treatment plan with patient or surrogate, discussions with consultants, evaluation of patient's response to treatment, examination of patient, obtaining history from patient or surrogate, ordering and performing treatments and interventions, ordering and review of  laboratory studies, ordering and review of radiographic studies, pulse oximetry, re-evaluation of patient's condition and review of old charts    EKG Interpretation  Date/Time:  Tuesday August 26 2020 17:27:45 EDT Ventricular Rate:  75 PR Interval:    QRS Duration: 151 QT Interval:  444 QTC Calculation: 496 R Axis:   92 Text Interpretation: Sinus rhythm Ventricular premature complex Short PR interval RBBB and LPFB No old tracing to compare Confirmed by Eber Hong ([id number]) on 08/26/2020 5:43:24 PM Also confirmed by Eber Hong ([id number]), editor Erenest Rasher [phone])  on 08/27/2020 7:29:46 AM         Eber Hong, MD 08/28/20 1458

## 2020-08-26 NOTE — ED Provider Notes (Signed)
[facility] EMERGENCY DEPARTMENT Provider Note   CSN: [id number] Arrival date & time: 08/26/20  1714     History Chief Complaint  Patient presents with  . Chest Pain    Jo Hays is a 64 y.o. female with PMH significant for type II DM on Januvia, HTN, HLD, mild intellectual disability, and CAD on Brilinta who presents to the ED via EMS from [facility] care home with complaints 6 out of 10 chest pain that began this morning.  History was obtained by EMS and patient was initially found to be 91% on RA.  On my examination, patient is resting in bed and 87% on RA.  She was once again placed on 2 L supplemental O2 via [place].  She states that for the past week she has been experiencing significant swelling in her feet and lower extremities.  She states that this is unusual and has not happened in the past.  She also states that she has been having some left-sided chest pain and feels short of breath.  She states that she has been sleeping sitting up for "years".  Patient also is endorsing a cough productive of yellow sputum.  She denies any fevers or chills, hemoptysis, unilateral swelling/edema, nausea, diaphoresis, or other symptoms.    She reports that she is followed by Dr. Mirna Mires with cardiology.  I reviewed her medical record and her most recent appointments have been with Curtis Sites NP at [facility].  She had a cardiac catheterization performed 04/2018 after her NSTEMI revealed severe LAD and RCA stenosis with thrombus and is now s/p drug-eluting stents.  She also has a history of RBBB with occasional PVC.  Echocardiogram obtained 04/2018 revealed EF reduced at 45%.    Patient believes that she has received her COVID-19 vaccinations.  She is level 5 caveat due to mild intellectual impairment.    HPI     Past Medical History:  Diagnosis Date  . Hyperlipidemia   . Hypertension   . Intellectual disability     There are no problems to  display for this patient.    OB History   No obstetric history on file.     No family history on file.  Social History   Tobacco Use  . Smoking status: Not on file  Substance Use Topics  . Alcohol use: Not on file  . Drug use: Not on file    Home Medications Prior to Admission medications   Not on File    Allergies    Patient has no allergy information on record.  Review of Systems   Review of Systems  All other systems reviewed and are negative.   Physical Exam Updated Vital Signs BP 130/85   Pulse 63   Temp 99 F (37.2 C) (Oral)   Resp 17   Ht 5\' 3"  (1.6 m)   Wt 111 kg   SpO2 93%   BMI 43.35 kg/m   Physical Exam Vitals and nursing note reviewed. Exam conducted with a chaperone present.  Constitutional:      Appearance: She is obese.  HENT:     Head: Normocephalic and atraumatic.  Eyes:     General: No scleral icterus.    Conjunctiva/sclera: Conjunctivae normal.  Cardiovascular:     Rate and Rhythm: Normal rate and regular rhythm.     Pulses: Normal pulses.     Heart sounds: Normal heart sounds.     Comments: Peripheral pulses intact and symmetric. Pulmonary:  Comments: Mildly increased work of breathing, but able to speak in full sentences.  No accessory muscle use or distress.  Breath sounds intact bilaterally, symmetric chest rise. Musculoskeletal:     Comments: 3+ pitting edema in lower extremities bilaterally.  Skin:    General: Skin is dry.     Capillary Refill: Capillary refill takes less than 2 seconds.  Neurological:     Mental Status: She is alert and oriented to person, place, and time.     GCS: GCS eye subscore is 4. GCS verbal subscore is 5. GCS motor subscore is 6.     Comments: Answering questions appropriately.  Psychiatric:        Mood and Affect: Mood normal.        Behavior: Behavior normal.        Thought Content: Thought content normal.     ED Results / Procedures / Treatments   Labs (all labs ordered are listed,  but only abnormal results are displayed) Labs Reviewed  COMPREHENSIVE METABOLIC PANEL - Abnormal; Notable for the following components:      Result Value   Glucose, Bld 217 (*)    Creatinine, Ser 1.25 (*)    Calcium 8.6 (*)    Albumin 2.8 (*)    GFR calc non Af Amer 45 (*)    GFR calc Af Amer 53 (*)    All other components within normal limits  BRAIN NATRIURETIC PEPTIDE - Abnormal; Notable for the following components:   B Natriuretic Peptide 193.1 (*)    All other components within normal limits  TROPONIN I (HIGH SENSITIVITY) - Abnormal; Notable for the following components:   Troponin I (High Sensitivity) 25 (*)    All other components within normal limits  TROPONIN I (HIGH SENSITIVITY) - Abnormal; Notable for the following components:   Troponin I (High Sensitivity) 28 (*)    All other components within normal limits  SARS CORONAVIRUS 2 BY RT PCR [facility] ORDER, PERFORMED IN [place] [facility])  CBC WITH DIFFERENTIAL/PLATELET    EKG EKG Interpretation  Date/Time:  Tuesday August 26 2020 17:27:45 EDT Ventricular Rate:  75 PR Interval:    QRS Duration: 151 QT Interval:  444 QTC Calculation: 496 R Axis:   92 Text Interpretation: Sinus rhythm Ventricular premature complex Short PR interval RBBB and LPFB No old tracing to compare Confirmed by Eber Hong ([id number]) on 08/26/2020 5:43:24 PM   Radiology DG Chest 2 View  Result Date: 08/26/2020 CLINICAL DATA:  Chest pain, productive cough EXAM: CHEST - 2 VIEW COMPARISON:  04/17/2018 FINDINGS: Lung volumes are small. Focal interstitial infiltrate is seen within the lingula. No pneumothorax or pleural effusion. Cardiac size is within normal limits when accounting for poor pulmonary insufflation. No acute bone abnormality., IMPRESSION: Focal lingular infiltrate, likely infectious in the appropriate clinical setting. Electronically Signed   By: Helyn Numbers MD   On: 08/26/2020 18:04    Procedures Procedures (including  critical care time)  Medications Ordered in ED Medications  nitroGLYCERIN 50 mg in dextrose 5 % 250 mL (0.2 mg/mL) infusion (10 mcg/min Intravenous Rate/Dose Verify 08/26/20 1917)  cefTRIAXone (ROCEPHIN) 1 g in sodium chloride 0.9 % 100 mL IVPB (has no administration in time range)  azithromycin (ZITHROMAX) 500 mg in sodium chloride 0.9 % 250 mL IVPB (has no administration in time range)  furosemide (LASIX) injection 40 mg (40 mg Intravenous Given 08/26/20 1843)    ED Course  I have reviewed the triage vital signs and the nursing  notes.  Pertinent labs & imaging results that were available during my care of the patient were reviewed by me and considered in my medical decision making (see chart for details).  Clinical Course as of Aug 26 2017  Tue Aug 26, 2020  2016 Spoke with hospitalist who will see and admit patient for her SOB with continued IV diuresis and antibiotics in setting of new oxygen requirement.   [GG]    Clinical Course User Index [GG] Lorelee New, PA-C   MDM Rules/Calculators/A&P                          Patient's history and physical exam is concerning for CHF exacerbation.  NTG drip initiated to reduce preload given her elevated blood pressures and will follow with IV 40 mg furosemide diuresis.    Labs Troponin: Mildly elevated 25, will trend. CBC with differential: No anemia or leukocytosis concerning for infection. BNP: Elevated at 193.1. CMP: Mild renal impairment with GFR reduced to 45 and elevated creatinine of 1.25, no recent labs with which to compare.  EKG is reviewed and demonstrates sinus rhythm with occasional PVC in the context of first-degree heart block and RBBB.  DG chest is personally reviewed which demonstrates focal lingular infiltrate, concerning for pneumonia.  There also appears to be some pulmonary edema which would be consistent with her lower extremity edema and swelling in the context of elevated BNP.  Given patient's new oxygen  requirement of 2 L supplemental O2 via [place] in addition to her possible pneumonia versus CHF exacerbation, will consult hospitalist for admission.  Will discuss case with Dr. Hyacinth Meeker who personally evaluated patient and agrees with assessment plan.  Spoke with hospitalist who will see and admit patient for her SOB with continued IV diuresis and antibiotics in setting of new oxygen requirement.   Final Clinical Impression(s) / ED Diagnoses Final diagnoses:  Acute pulmonary edema [facility])  Hypertensive crisis    Rx / DC Orders ED Discharge Orders    None       Elvera Maria 08/26/20 2018    Eber Hong, MD 08/26/20 2057

## 2020-08-27 ENCOUNTER — Inpatient Hospital Stay (HOSPITAL_COMMUNITY): Payer: Medicare HMO

## 2020-08-27 ENCOUNTER — Encounter (HOSPITAL_COMMUNITY): Payer: Self-pay | Admitting: Internal Medicine

## 2020-08-27 DIAGNOSIS — I160 Hypertensive urgency: Secondary | ICD-10-CM

## 2020-08-27 DIAGNOSIS — R079 Chest pain, unspecified: Secondary | ICD-10-CM

## 2020-08-27 DIAGNOSIS — E119 Type 2 diabetes mellitus without complications: Secondary | ICD-10-CM

## 2020-08-27 DIAGNOSIS — I200 Unstable angina: Secondary | ICD-10-CM | POA: Diagnosis present

## 2020-08-27 DIAGNOSIS — I25118 Atherosclerotic heart disease of native coronary artery with other forms of angina pectoris: Secondary | ICD-10-CM

## 2020-08-27 HISTORY — DX: Type 2 diabetes mellitus without complications: E11.9

## 2020-08-27 LAB — ECHOCARDIOGRAM COMPLETE
Height: 63 in
S' Lateral: 4.5 cm
Weight: 3915.37 oz

## 2020-08-27 LAB — CBC
HCT: 42.2 % (ref 36.0–46.0)
Hemoglobin: 13.1 g/dL (ref 12.0–15.0)
MCH: 27.2 pg (ref 26.0–34.0)
MCHC: 31 g/dL (ref 30.0–36.0)
MCV: 87.6 fL (ref 80.0–100.0)
Platelets: 210 10*3/uL (ref 150–400)
RBC: 4.82 MIL/uL (ref 3.87–5.11)
RDW: 15.4 % (ref 11.5–15.5)
WBC: 11.1 10*3/uL — ABNORMAL HIGH (ref 4.0–10.5)
nRBC: 0 % (ref 0.0–0.2)

## 2020-08-27 LAB — GLUCOSE, CAPILLARY: Glucose-Capillary: 181 mg/dL — ABNORMAL HIGH (ref 70–99)

## 2020-08-27 LAB — BASIC METABOLIC PANEL
Anion gap: 10 (ref 5–15)
BUN: 17 mg/dL (ref 8–23)
CO2: 27 mmol/L (ref 22–32)
Calcium: 8.1 mg/dL — ABNORMAL LOW (ref 8.9–10.3)
Chloride: 101 mmol/L (ref 98–111)
Creatinine, Ser: 1.2 mg/dL — ABNORMAL HIGH (ref 0.44–1.00)
GFR calc Af Amer: 55 mL/min — ABNORMAL LOW (ref >60)
GFR calc non Af Amer: 48 mL/min — ABNORMAL LOW (ref >60)
Glucose, Bld: 136 mg/dL — ABNORMAL HIGH (ref 70–99)
Potassium: 4 mmol/L (ref 3.5–5.1)
Sodium: 138 mmol/L (ref 135–145)

## 2020-08-27 LAB — CBG MONITORING, ED
Glucose-Capillary: 126 mg/dL — ABNORMAL HIGH (ref 70–99)
Glucose-Capillary: 141 mg/dL — ABNORMAL HIGH (ref 70–99)

## 2020-08-27 LAB — D-DIMER, QUANTITATIVE: D-Dimer, Quant: 1.67 ug/mL-FEU — ABNORMAL HIGH (ref 0.00–0.50)

## 2020-08-27 LAB — HEMOGLOBIN A1C
Hgb A1c MFr Bld: 8.7 % — ABNORMAL HIGH (ref 4.8–5.6)
Mean Plasma Glucose: 202.99 mg/dL

## 2020-08-27 MED ORDER — PERFLUTREN LIPID MICROSPHERE
1.0000 mL | INTRAVENOUS | Status: AC | PRN
  Administered 2020-08-27: 4 mL via INTRAVENOUS
  Filled 2020-08-27: qty 10

## 2020-08-27 MED ORDER — TRAZODONE HCL 150 MG PO TABS
75.0000 mg | ORAL_TABLET | Freq: Every day | ORAL | Status: DC
  Administered 2020-08-27 – 2020-09-03 (×9): 75 mg via ORAL
  Filled 2020-08-27: qty 1
  Filled 2020-08-27: qty 2
  Filled 2020-08-27 (×7): qty 1

## 2020-08-27 MED ORDER — MELATONIN 5 MG PO TABS
10.0000 mg | ORAL_TABLET | Freq: Every day | ORAL | Status: DC
  Administered 2020-08-27 – 2020-09-03 (×9): 10 mg via ORAL
  Filled 2020-08-27 (×10): qty 2

## 2020-08-27 MED ORDER — FUROSEMIDE 10 MG/ML IJ SOLN: 40.0000 mg | Freq: Every day | INTRAMUSCULAR | Status: DC

## 2020-08-27 MED ORDER — METOPROLOL TARTRATE 25 MG PO TABS
25.0000 mg | ORAL_TABLET | Freq: Two times a day (BID) | ORAL | Status: DC
  Administered 2020-08-27 – 2020-08-28 (×5): 25 mg via ORAL
  Filled 2020-08-27 (×5): qty 1

## 2020-08-27 MED ORDER — CITALOPRAM HYDROBROMIDE 20 MG PO TABS
20.0000 mg | ORAL_TABLET | Freq: Every day | ORAL | Status: DC
  Administered 2020-08-27 – 2020-09-03 (×9): 20 mg via ORAL
  Filled 2020-08-27: qty 1
  Filled 2020-08-27: qty 2
  Filled 2020-08-27 (×7): qty 1

## 2020-08-27 MED ORDER — RISPERIDONE 0.5 MG PO TABS
0.5000 mg | ORAL_TABLET | Freq: Two times a day (BID) | ORAL | Status: DC
  Administered 2020-08-27 – 2020-09-03 (×17): 0.5 mg via ORAL
  Filled 2020-08-27 (×18): qty 1

## 2020-08-27 MED ORDER — LISINOPRIL 20 MG PO TABS
20.0000 mg | ORAL_TABLET | Freq: Every day | ORAL | Status: DC
  Administered 2020-08-27: 20 mg via ORAL
  Filled 2020-08-27: qty 1

## 2020-08-27 MED ORDER — TICAGRELOR 90 MG PO TABS
90.0000 mg | ORAL_TABLET | Freq: Two times a day (BID) | ORAL | Status: DC
  Administered 2020-08-27 – 2020-09-03 (×17): 90 mg via ORAL
  Filled 2020-08-27 (×17): qty 1

## 2020-08-27 MED ORDER — FUROSEMIDE 10 MG/ML IJ SOLN
40.0000 mg | Freq: Two times a day (BID) | INTRAMUSCULAR | Status: DC
  Administered 2020-08-27 – 2020-08-29 (×5): 40 mg via INTRAVENOUS
  Filled 2020-08-27 (×6): qty 4

## 2020-08-27 MED ORDER — ASPIRIN EC 81 MG PO TBEC
81.0000 mg | DELAYED_RELEASE_TABLET | Freq: Every day | ORAL | Status: DC
  Administered 2020-08-27 – 2020-09-03 (×8): 81 mg via ORAL
  Filled 2020-08-27 (×8): qty 1

## 2020-08-27 MED ORDER — TRAMADOL HCL 50 MG PO TABS
50.0000 mg | ORAL_TABLET | Freq: Once | ORAL | Status: AC
  Administered 2020-08-27: 50 mg via ORAL
  Filled 2020-08-27: qty 1

## 2020-08-27 MED ORDER — INSULIN ASPART 100 UNIT/ML ~~LOC~~ SOLN
0.0000 [IU] | Freq: Three times a day (TID) | SUBCUTANEOUS | Status: DC
  Administered 2020-08-27 (×2): 1 [IU] via SUBCUTANEOUS
  Administered 2020-08-28: 2 [IU] via SUBCUTANEOUS
  Administered 2020-08-28: 1 [IU] via SUBCUTANEOUS
  Administered 2020-08-28: 2 [IU] via SUBCUTANEOUS
  Administered 2020-08-29 – 2020-08-30 (×2): 1 [IU] via SUBCUTANEOUS
  Administered 2020-08-30: 2 [IU] via SUBCUTANEOUS
  Administered 2020-08-31 (×3): 1 [IU] via SUBCUTANEOUS
  Administered 2020-09-01: 2 [IU] via SUBCUTANEOUS
  Administered 2020-09-01 – 2020-09-02 (×2): 1 [IU] via SUBCUTANEOUS
  Administered 2020-09-02: 2 [IU] via SUBCUTANEOUS
  Administered 2020-09-03: 1 [IU] via SUBCUTANEOUS
  Administered 2020-09-03: 3 [IU] via SUBCUTANEOUS

## 2020-08-27 MED ORDER — ISOSORBIDE MONONITRATE ER 30 MG PO TB24
30.0000 mg | ORAL_TABLET | Freq: Every day | ORAL | Status: DC
  Administered 2020-08-27 – 2020-09-03 (×8): 30 mg via ORAL
  Filled 2020-08-27 (×8): qty 1

## 2020-08-27 MED ORDER — PRAVASTATIN SODIUM 40 MG PO TABS
40.0000 mg | ORAL_TABLET | Freq: Every day | ORAL | Status: DC
  Administered 2020-08-27 – 2020-09-03 (×9): 40 mg via ORAL
  Filled 2020-08-27 (×9): qty 1

## 2020-08-27 NOTE — Progress Notes (Signed)
[facility] Progress Note  Patient: Jo Hays    SWH:[id number]  DOA: 08/26/2020     Date of Service: the patient was seen and examined on 08/27/2020  Brief hospital course: Past medical history of CAD SP stenting, HTN, HLD, depression. Presents with complaints of chest pain.  Cardiology consult. Currently plan is continue current care.  Assessment and Plan: 1. Unstable angina Cardiology consulted. Troponins are elevated but not consistent with ACS. Continue aspirin continue Brilinta, continue beta-blocker, continue statin. Echo ordered. No indication for nitroglycerin drip.  Will discontinue. Also no indication for therapeutic anticoagulation.  2. Hypertensive urgency  likely contributing to patient's symptoms Continue Coreg 6.25 mg twice daily, Lasix. Holding lisinopril. Added Imdur  3. Possible pneumonia  patient does not have any fever or WBC count.  If procalcitonin is negative will discontinue antibiotics.  4. Hyperglycemia  concerning for new onset diabetes mellitus for which we will check hemoglobin A1c check CBGs closely.  5. History of depression  we will continue home medications.  6. chronic kidney disease stage III  Creatinine has increased from the 1 in Care Everywhere available in 2019. Not know of the creatinine of recent past.   Given that patient has proteinuria patient likely has chronic kidney disease.   Follow metabolic panel closely.  6.  Morbid obesity Body mass index is 43.35 kg/m.  Continue with current care.  Patient will benefit from dietary consultation.  Diet: Advance diet DVT Prophylaxis:   enoxaparin (LOVENOX) injection 40 mg Start: 08/26/20 2200    Advance goals of care discussion: Full code  Family Communication: no family was present at bedside, at the time of interview.   Disposition:  Status is: Inpatient  Remains inpatient appropriate because:Ongoing diagnostic testing needed not appropriate for outpatient work  up   Dispo: The patient is from: Home              Anticipated d/c is to: Home              Anticipated d/c date is: 2 days              Patient currently is not medically stable to d/c. Subjective: Continues to have some chest pain.  No nausea no vomiting no fever no chills.  Physical Exam:  General: Appear in mild distress, no Rash; Oral Mucosa Clear, moist. no Abnormal Neck Mass Or lumps, Conjunctiva normal  Cardiovascular: S1 and S2 Present, no Murmur, Respiratory: good respiratory effort, Bilateral Air entry present and CTA, no Crackles, no wheezes Abdomen: Bowel Sound present, Soft and no tenderness Extremities: bilateral Pedal edema Neurology: alert and oriented to time, place, and person affect appropriate. no new focal deficit Gait not checked due to patient safety concerns  Vitals:   08/27/20 1300 08/27/20 1400 08/27/20 1430 08/27/20 1630  BP:  (!) 147/75 (!) 133/112 (!) 169/86  Pulse: 73 66 60 62  Resp: 16 16 14 15   Temp:      TempSrc:      SpO2: 91% 96% 92% 95%  Weight:      Height:        Intake/Output Summary (Last 24 hours) at 08/27/2020 1944 Last data filed at 08/27/2020 1425 Gross per 24 hour  Intake --  Output 960 ml  Net -960 ml   Filed Weights   08/26/20 1819  Weight: 111 kg    Data Reviewed: I have personally reviewed and interpreted daily labs, tele strips, imagings as discussed above. I reviewed all nursing notes,  pharmacy notes, vitals, pertinent old records I have discussed plan of care as described above with RN and patient/family.  CBC: Recent Labs  Lab 08/26/20 1725 08/26/20 2055 08/27/20 0452  WBC 10.0 10.4 11.1*  NEUTROABS 7.6  --   --   HGB 13.3 12.7 13.1  HCT 42.1 41.3 42.2  MCV 86.4 86.2 87.6  PLT 238 230 210   Basic Metabolic Panel: Recent Labs  Lab 08/26/20 1725 08/26/20 2055 08/27/20 0452  NA 136  --  138  K 3.9  --  4.0  CL 98  --  101  CO2 27  --  27  GLUCOSE 217*  --  136*  BUN 15  --  17  CREATININE 1.25*  1.26* 1.20*  CALCIUM 8.6*  --  8.1*    Studies: ECHOCARDIOGRAM COMPLETE  Result Date: 08/27/2020    ECHOCARDIOGRAM REPORT   Patient Name:   Myrlene BrokerONITA C Hendley Date of Exam: 08/27/2020 Medical Rec #:  [id number]      Height:       63.0 in Accession #:    [id number]     Weight:       244.7 lb Date of Birth:  07/27/1956      BSA:          2.107 m Patient Age:    64 years       BP:           136/74 mmHg Patient Gender: F              HR:           64 bpm. Exam Location:  Inpatient Procedure: 2D Echo, Color Doppler, Cardiac Doppler and Intracardiac            Opacification Agent Indications:    R06.9 DOE  History:        Patient has prior history of Echocardiogram examinations, most                 recent 04/18/2018. CAD; Risk Factors:Hypertension and                 Dyslipidemia. [facility]  Sonographer:    Irving BurtonEmily Senior RDCS Referring Phys: [phone] Eduard ClosARSHAD N KAKRAKANDY  Sonographer Comments: Technically difficult study due to patient body habitus. IMPRESSIONS  1. Technically difficult echo with poor image quality. the valves were not visualized well.  2. Left ventricular ejection fraction, by estimation, is 60 to 65%. The left ventricle has normal function. The left ventricle has no regional wall motion abnormalities. Left ventricular diastolic parameters are consistent with Grade II diastolic dysfunction (pseudonormalization).  3. Right ventricular systolic function was not well visualized. The right ventricular size is not well visualized.  4. Right atrial size was mildly dilated.  5. The mitral valve was not well visualized. No evidence of mitral valve regurgitation.  6. The aortic valve was not well visualized. Aortic valve regurgitation is not visualized. FINDINGS  Left Ventricle: Left ventricular ejection fraction, by estimation, is 60 to 65%. The left ventricle has normal function. The left ventricle has no regional wall motion abnormalities. Definity contrast agent was given IV to delineate the left  ventricular  endocardial borders. The left ventricular internal cavity size was normal in size. There is no left ventricular hypertrophy. Left ventricular diastolic parameters are consistent with Grade II diastolic dysfunction (pseudonormalization). Right Ventricle: The right ventricular size is not well visualized. Right vetricular wall thickness was not well visualized. Right ventricular systolic function  was not well visualized. Left Atrium: Left atrial size was normal in size. Right Atrium: Right atrial size was mildly dilated. Pericardium: There is no evidence of pericardial effusion. Mitral Valve: The mitral valve was not well visualized. No evidence of mitral valve regurgitation. Tricuspid Valve: The tricuspid valve is not well visualized. Tricuspid valve regurgitation is not demonstrated. Aortic Valve: The aortic valve was not well visualized. Aortic valve regurgitation is not visualized. Pulmonic Valve: The pulmonic valve was not well visualized. Pulmonic valve regurgitation is not visualized. Aorta: The aortic root and ascending aorta are structurally normal, with no evidence of dilitation. IAS/Shunts: The atrial septum is grossly normal. Additional Comments: Technically difficult echo with poor image quality. the valves were not visualized well.  LEFT VENTRICLE PLAX 2D LVIDd:         5.20 cm  Diastology LVIDs:         4.50 cm  LV e' medial:    4.90 cm/s LV PW:         1.20 cm  LV E/e' medial:  21.4 LV IVS:        1.10 cm  LV e' lateral:   6.64 cm/s LVOT diam:     2.00 cm  LV E/e' lateral: 15.8 LV SV:         79 LV SV Index:   37 LVOT Area:     3.14 cm  RIGHT VENTRICLE RV S prime:     9.46 cm/s TAPSE (M-mode): 2.2 cm LEFT ATRIUM             Index       RIGHT ATRIUM           Index LA diam:        3.40 cm 1.61 cm/m  RA Area:     19.20 cm LA Vol (A2C):   40.0 ml 18.99 ml/m RA Volume:   58.00 ml  27.53 ml/m LA Vol (A4C):   54.9 ml 26.06 ml/m LA Biplane Vol: 50.0 ml 23.73 ml/m  AORTIC VALVE LVOT Vmax:    110.00 cm/s LVOT Vmean:  78.000 cm/s LVOT VTI:    0.250 m  AORTA Ao Root diam: 3.30 cm MV E velocity: 105.00 cm/s MV A velocity: 62.10 cm/s   SHUNTS MV E/A ratio:  1.69         Systemic VTI:  0.25 m                             Systemic Diam: 2.00 cm Kristeen Miss MD Electronically signed by Kristeen Miss MD Signature Date/Time: 08/27/2020/2:15:11 PM    Final     Scheduled Meds: . aspirin EC  81 mg Oral Daily  . citalopram  20 mg Oral QHS  . enoxaparin (LOVENOX) injection  40 mg Subcutaneous Q24H  . furosemide  40 mg Intravenous BID  . insulin aspart  0-9 Units Subcutaneous TID WC  . isosorbide mononitrate  30 mg Oral Daily  . melatonin  10 mg Oral QHS  . metoprolol tartrate  25 mg Oral BID  . pravastatin  40 mg Oral q1800  . risperiDONE  0.5 mg Oral BID  . ticagrelor  90 mg Oral BID  . traZODone  75 mg Oral QHS   Continuous Infusions: . azithromycin    . cefTRIAXone (ROCEPHIN)  IV     PRN Meds: ondansetron **OR** ondansetron (ZOFRAN) IV  Time spent: 35 minutes  Author: Lynden Oxford, MD [facility] 08/27/2020  7:44 PM  To reach On-call, see care teams to locate the attending and reach out via [email or web address]. Between 7PM-7AM, please contact night-coverage If you still have difficulty reaching the attending provider, please page the [facility] (Director on Call) for [facility] on amion for assistance.

## 2020-08-27 NOTE — Progress Notes (Signed)
Echocardiogram 2D Echocardiogram has been performed.  Jo Hays 08/27/2020, 12:06 PM

## 2020-08-27 NOTE — ED Notes (Signed)
Update given to [facility] - Legal Guardian

## 2020-08-27 NOTE — Consult Note (Addendum)
Cardiology Consultation:   Patient ID: Jo Hays; [id number]; 14-Sep-1956   Admit date: 08/26/2020 Date of Consult: 08/27/2020  Primary Care Provider: Patient, No Pcp Per Primary Cardiologist: No primary care provider on file.  Talbert Cage, NP 05/13/2020 [place] Primary Electrophysiologist:  None   Patient Profile:   Jo Hays is a 64 y.o. female with a hx of NSTEMI 04/2018 s/p DES RCA & LAD w/ PTCA D1, HTN, HLD, RBBB, Dementia, who is being seen today for the evaluation of CHF at the request of Dr Posey Pronto.  History of Present Illness:   [name] was seen by Ms Rosana Hoes 06/01 and was doing well. No c/o CP or SOB. No med changes or testing needed.   [name] is not able to say how long her legs have been swelling. She does not know how much weight she has gained. She cannot breathe lying down. She is SOB whenever she tries to do anything. She is breathing a little better since being in the ER. She is reluctant to use a PureWick but agreed to have it placed.   She does not know what her BP usually runs. Staff gives her medications, so she is compliant, but does not know what she takes.   She is not having chest pain or palpitations.    Past Medical History:  Diagnosis Date  . Hyperlipidemia   . Hypertension   . Intellectual disability   . NSTEMI (non-ST elevated myocardial infarction) ([place]) 04/2018    Past Surgical History:  Procedure Laterality Date  . CORONARY ANGIOPLASTY WITH STENT PLACEMENT  04/2018   at [place], s/p DES RCA and LAD, PTCA Diag1, Talbert Cage, NP sees     Prior to Admission medications   Medication Sig Start Date End Date Taking? Authorizing Provider  acetaminophen (TYLENOL) 325 MG tablet Take 650 mg by mouth every 6 (six) hours as needed for moderate pain.   Yes [provider]  aspirin EC 81 MG tablet Take 81 mg by mouth daily. Swallow whole.   Yes [provider]  citalopram (CELEXA) 20 MG tablet Take 20 mg by  mouth at bedtime.   Yes [provider]  lisinopril (ZESTRIL) 20 MG tablet Take 20 mg by mouth daily.   Yes [provider]  Melatonin 10 MG TABS Take 10 mg by mouth at bedtime.   Yes [provider]  meloxicam (MOBIC) 15 MG tablet Take 15 mg by mouth daily.   Yes [provider]  metoprolol tartrate (LOPRESSOR) 25 MG tablet Take 25 mg by mouth 2 (two) times daily.   Yes [provider]  pravastatin (PRAVACHOL) 40 MG tablet Take 40 mg by mouth daily.   Yes [provider]  risperiDONE (RISPERDAL) 0.5 MG tablet Take 0.5 mg by mouth 2 (two) times daily.   Yes [provider]  sitaGLIPtin (JANUVIA) 25 MG tablet Take 25 mg by mouth daily.   Yes [provider]  ticagrelor (BRILINTA) 90 MG TABS tablet Take 90 mg by mouth 2 (two) times daily.   Yes [provider]  traZODone (DESYREL) 50 MG tablet Take 75 mg by mouth at bedtime.   Yes [provider]    Inpatient Medications: Scheduled Meds: . aspirin EC  81 mg Oral Daily  . citalopram  20 mg Oral QHS  . enoxaparin (LOVENOX) injection  40 mg Subcutaneous Q24H  . insulin aspart  0-9 Units Subcutaneous TID WC  . lisinopril  20 mg Oral Daily  .  melatonin  10 mg Oral QHS  . metoprolol tartrate  25 mg Oral BID  . pravastatin  40 mg Oral q1800  . risperiDONE  0.5 mg Oral BID  . ticagrelor  90 mg Oral BID  . traZODone  75 mg Oral QHS   Continuous Infusions: . azithromycin    . cefTRIAXone (ROCEPHIN)  IV    . nitroGLYCERIN 10 mcg/min (08/26/20 1917)   PRN Meds: ondansetron **OR** ondansetron (ZOFRAN) IV  Allergies:   No Known Allergies  Social History:   Social History   Socioeconomic History  . Marital status: Single    Spouse name: Not on file  . Number of children: Not on file  . Years of education: Not on file  . Highest education level: Not on file  Occupational History  . Not on file  Tobacco Use  . Smoking status: Never Smoker  .  Smokeless tobacco: Former Network engineer and Sexual Activity  . Alcohol use: Not on file  . Drug use: Not on file  . Sexual activity: Not on file  Other Topics Concern  . Not on file  Social History Narrative  . Not on file   Social Determinants of Health   Financial Resource Strain:   . Difficulty of Paying Living Expenses: Not on file  Food Insecurity:   . Worried About Charity fundraiser in the Last Year: Not on file  . Ran Out of Food in the Last Year: Not on file  Transportation Needs:   . Lack of Transportation (Medical): Not on file  . Lack of Transportation (Non-Medical): Not on file  Physical Activity:   . Days of Exercise per Week: Not on file  . Minutes of Exercise per Session: Not on file  Stress:   . Feeling of Stress : Not on file  Social Connections:   . Frequency of Communication with Friends and Family: Not on file  . Frequency of Social Gatherings with Friends and Family: Not on file  . Attends Religious Services: Not on file  . Active Member of Clubs or Organizations: Not on file  . Attends Archivist Meetings: Not on file  . Marital Status: Not on file  Intimate Partner Violence:   . Fear of Current or Ex-Partner: Not on file  . Emotionally Abused: Not on file  . Physically Abused: Not on file  . Sexually Abused: Not on file    Family History:  Pt not aware of any history of CAD in parents Family History  Family history unknown: Yes   Family Status:  Family Status  Relation Name Status  . Mother  Deceased  . Father  Deceased    ROS:  Please see the history of present illness.  All other ROS reviewed and negative.     Physical Exam/Data:   Vitals:   08/27/20 0600 08/27/20 0800 08/27/20 0830 08/27/20 0900  BP: (!) 168/96 136/74 (!) 142/75 131/75  Pulse: (!) 59 62 61 63  Resp: 16 19 16 20   Temp:      TempSrc:      SpO2: 92% 90% 91% 91%  Weight:      Height:        Intake/Output Summary (Last 24 hours) at 08/27/2020  1019 Last data filed at 08/27/2020 0757 Gross per 24 hour  Intake --  Output 400 ml  Net -400 ml    Last 3 Weights 08/26/2020  Weight (lbs) 244 lb 11.4 oz  Weight (kg) 111 kg  Body mass index is 43.35 kg/m.   General:  Well nourished, well developed, female in no acute distress HEENT: normal Lymph: no adenopathy Neck: JVD - difficult to assess 2nd body habitus Endocrine:  No thryomegaly Vascular: No carotid bruits; 4/4 extremity pulses 2+  Cardiac:  normal S1, S2; RRR; no murmur Lungs: rales bases bilaterally, no wheezing, rhonchi Abd: soft, nontender, no hepatomegaly  Ext: 1-2+ edema Musculoskeletal:  No deformities, BUE and BLE strength normal and equal Skin: warm and dry  Neuro:  CNs 2-12 intact, no focal abnormalities noted Psych:  Normal affect   EKG:  The EKG was personally reviewed and demonstrates:  09/14 ECG is SR, HR 75, PVC seen, RBBB is old Telemetry:  Telemetry was personally reviewed and demonstrates:  SR, occ PVCs   CV studies:   ECHO: ordered  ECHO: 04/18/2018 Summary Moderate concentric left ventricular hypertrophy Inferior wall hypokinesis Ejection fraction is visually estimated at 45-50% Normal right ventricle structure and function. No significant valvular abnormalites noted Mild mitral regurgitation. Findings Mitral Valve Structurally normal mitral valve. Mild mitral regurgitation. Aortic Valve The aortic valve appears to be trileaflet. No aortic stenosis. No aortic regurgitation. Tricuspid Valve Tricuspid valve is structurally normal. No significant tricuspid regurgitation . Pulmonic Valve Normal pulmonic valve structure and mobility. Left Atrium Normal size left atrium. Left atrial volume index of 23 ml per meters squared BSA. Left Ventricle Ejection fraction is visually estimated at 45-50% Moderate concentric left ventricular hypertrophy Right Atrium Normal right atrium. Right Ventricle Normal right ventricle structure and  function. Pericardial Effusion No evidence of pericardial effusion. Pleural Effusion No evidence of pleural effusion. Miscellaneous The aorta is within normal limits. Intact interatrial septum with no obvious shunt by color doppler. IVC is normal and collapses M-Mode/2D Measurements & Calculations LV Diastolic Dimension: LV Systolic Dimension:   LA Dimension: 3.58 cmAO 4.75 cm         3.64 cm          Root Dimension: 2.93 cm LV FS:23.37 %      LV Volume Diastolic: 557 LV PW Diastolic: 3.22 cm ml Septum Diastolic: 0.25  LV Volume Systolic: 42.7 cm  ml             LV EDV/LV EDV Index: 105  LA/Aorta: 1.22             ml/55 m2LV ESV/LV ESV             Index: 55.8 ml/29 m2             EF Calculated: 46.86 %             LVOT: 2.21 cm Doppler Measurements & Calculations MV Peak E-Wave: 73.2 cm/s           LVOT Peak Velocity: 132 cm/s MV Peak A-Wave: 79.8 cm/s           LVOT Mean Velocity: 74.4 cm/s MV E/A Ratio: 0.92              LVOT Peak Gradient: 6.94 MV Peak Gradient: 2.14 mmHg          mmHgLVOT Mean Gradient: 2.77               LVOT VTI: 22.1 cm mmHg TDI E' Velocity: 8.2 cm/s E/E' prime 8.8  CATH: 04/20/2018 Diagnostic procedure: PCI procedure: PTCA , Stent DES, PTCA Additional Vessel, Stent DES Additional Vessel Complications: No Complications. Conclusions Interventional Summary Successful PCI / Xience Drug Eluting Stent of the mid Right Coronary  Artery. Successful PCI / Xience Drug Eluting Stent of the proximal Right Coronary Artery. Successful PCI / Xience Drug Eluting Stent of the proximal Left Anterior Descending Coronary Artery. Successful PCI / PTCA of the ostial 1st Diagonal Coronary Artery. Interventional Recommendations Cardiac Arteries and Lesion Findings LMCA: 0%. LAD: Lesion on Mid LAD: Proximal subsection.95%  stenosis 9 mm length reduced to 0%. Pre procedure TIMI III flow was noted. Post Procedure TIMI III flow was present. Good run off was present. The lesion was diagnosed as Moderate Risk (B). Bifurcation lesion. Devices used - Abbott 0.014" x 190cm BMW J-Tip PTCI Guidewire. Number of passes: 1. - 3.36m x 135mXience Sierra Des Stent. 1 inflation(s) to a max pressure of: 16 atm. - 3.5x0878mC Euphora. 1 inflation(s) to a max pressure of: 10 atm. Lesion on 1st Diag: Ostial.95% stenosis 5 mm length reduced to 0%. Pre procedure TIMI III flow was noted. Post Procedure TIMI III flow was present. Good run off was present. The lesion was diagnosed as Moderate Risk (B). Devices used - Abbott 0.014" x 190cm BMW J-Tip PTCI Guidewire. Number of passes: 1. - 2.5 mm X 8 mm Trek RX. 2 inflation(s) to a max pressure of: 10 atm. RCA: Lesion on Prox RCA: Proximal subsection.95% stenosis 8 mm length reduced to 0%. Pre procedure TIMI III flow was noted. Post Procedure TIMI III flow was present. Good run off was present. The lesion was diagnosed as Moderate Risk (B). Devices used - Abbott 0.014" x 190cm BMW J-Tip PTCI Guidewire. Number of passes: 1. - 2.5x15 Euphora. 1 inflation(s) to a max pressure of: 14 atm. - 3.25 mm x 38 mm Xience Sierra DES Stent. 1 inflation(s) to a max pressure of: 14 atm. - 3.25 mm x 12 mm Xience Sierra DES Stent. 3 inflation(s) to a max pressure of: 16 atm. Lesion on Mid RCA: Mid subsection.80% stenosis 30 mm length reduced to 0%. Pre procedure TIMI III flow was noted. Post Procedure TIMI III flow was present. Good run off was present. The lesion was diagnosed as Moderate Risk (B).  Laboratory Data:   Chemistry Recent Labs  Lab 08/26/20 1725 08/26/20 2055 08/27/20 0452  NA 136  --  138  K 3.9  --  4.0  CL 98  --  101  CO2 27  --  27  GLUCOSE 217*  --  136*  BUN 15  --  17  CREATININE 1.25* 1.26* 1.20*  CALCIUM 8.6*  --  8.1*  GFRNONAA  45* 45* 48*  GFRAA 53* 52* 55*  ANIONGAP 11  --  10    Lab Results  Component Value Date   ALT 22 08/26/2020   AST 18 08/26/2020   ALKPHOS 64 08/26/2020   BILITOT 0.5 08/26/2020   Hematology Recent Labs  Lab 08/26/20 1725 08/26/20 2055 08/27/20 0452  WBC 10.0 10.4 11.1*  RBC 4.87 4.79 4.82  HGB 13.3 12.7 13.1  HCT 42.1 41.3 42.2  MCV 86.4 86.2 87.6  MCH 27.3 26.5 27.2  MCHC 31.6 30.8 31.0  RDW 15.5 15.1 15.4  PLT 238 230 210   Cardiac Enzymes High Sensitivity Troponin:   Recent Labs  Lab 08/26/20 1725 08/26/20 1923 08/26/20 2055 08/26/20 2300  TROPONINIHS 25* 28* 24* 29*      BNP Recent Labs  Lab 08/26/20 1734  BNP 193.1*    TSH:  Lab Results  Component Value Date   TSH 2.413 08/26/2020   Lipids:No results found for: CHOL, HDL, LDLCALC, LDLDIRECT, TRIG, CHOLHDL  HgbA1c: Lab Results  Component Value Date   HGBA1C 8.7 (H) 08/27/2020   Magnesium: No results found for: MG   Radiology/Studies:  DG Chest 2 View  Result Date: 08/26/2020 CLINICAL DATA:  Chest pain, productive cough EXAM: CHEST - 2 VIEW COMPARISON:  04/17/2018 FINDINGS: Lung volumes are small. Focal interstitial infiltrate is seen within the lingula. No pneumothorax or pleural effusion. Cardiac size is within normal limits when accounting for poor pulmonary insufflation. No acute bone abnormality., IMPRESSION: Focal lingular infiltrate, likely infectious in the appropriate clinical setting. Electronically Signed   By: Fidela Salisbury MD   On: 08/26/2020 18:04    Assessment and Plan:   1. Acute on chronic CHF - echo pending, EF prev normal - still w/ volume overload, continue diuresis for now - need daily wts, I/O's and daily BMET - not on a diuretic pta, concern that EF is decreased - f/u on echo  2. Chest pain, Elevated troponin - CP improved w/ BP improvement and treatment of CHF - review echo - mild, flat trop is more c/w CHF than CAD - MD advise on further eval, this will be  influenced by echo results - continue ASA, Brilinta, BB, statin - ck lipid profile  3. HTN - on home doses metoprolol 25 mg bid and lisinopril 20 mg qd  - change BB to Coreg 6.25 mg bid and follow BP/HR - she will need a diuretic at d/c - can increase the lisinopril prn for better BP control, but will wait to see what echo shows, she may need more CHF meds.  Otherwise, per IM Principal Problem:   Unstable angina ([place]) Active Problems:   Acute respiratory failure (HCC)   CAD (coronary artery disease)   Hypertensive urgency   For questions or updates, please contact [facility] Please consult [email or web address] for contact info under Cardiology/STEMI.   Signed, Rosaria Ferries, PA-C  08/27/2020 10:19 AM   I have examined the patient and reviewed assessment and plan and discussed with patient.  Agree with above as stated.    Increased, but flat,  troponin likely from demand ischemia/volume overload/hypertensive urgency.   BP control.  IV diuresis.  Check echo.  Will follow along.    Larae Grooms

## 2020-08-28 LAB — BASIC METABOLIC PANEL
Anion gap: 10 (ref 5–15)
BUN: 15 mg/dL (ref 8–23)
CO2: 29 mmol/L (ref 22–32)
Calcium: 8.2 mg/dL — ABNORMAL LOW (ref 8.9–10.3)
Chloride: 100 mmol/L (ref 98–111)
Creatinine, Ser: 1.23 mg/dL — ABNORMAL HIGH (ref 0.44–1.00)
GFR calc Af Amer: 54 mL/min — ABNORMAL LOW (ref >60)
GFR calc non Af Amer: 46 mL/min — ABNORMAL LOW (ref >60)
Glucose, Bld: 121 mg/dL — ABNORMAL HIGH (ref 70–99)
Potassium: 3.9 mmol/L (ref 3.5–5.1)
Sodium: 139 mmol/L (ref 135–145)

## 2020-08-28 LAB — GLUCOSE, CAPILLARY
Glucose-Capillary: 121 mg/dL — ABNORMAL HIGH (ref 70–99)
Glucose-Capillary: 159 mg/dL — ABNORMAL HIGH (ref 70–99)
Glucose-Capillary: 177 mg/dL — ABNORMAL HIGH (ref 70–99)
Glucose-Capillary: 106 mg/dL — ABNORMAL HIGH (ref 70–99)

## 2020-08-28 LAB — MRSA PCR SCREENING: MRSA by PCR: NEGATIVE

## 2020-08-28 MED ORDER — POTASSIUM CHLORIDE CRYS ER 20 MEQ PO TBCR
40.0000 meq | EXTENDED_RELEASE_TABLET | Freq: Once | ORAL | Status: AC
  Administered 2020-08-28: 40 meq via ORAL
  Filled 2020-08-28: qty 2

## 2020-08-28 NOTE — Progress Notes (Signed)
Paged on call [facility] Dr. Leafy Half as pt stated her back and legs hurt. Per pt its not new pain, and tylenol doesn't help. See MAR for medication administration. Will continue to monitor.

## 2020-08-28 NOTE — Social Work (Signed)
CSW contacted patient's  [facility] Legal  Guardian  APS/SW [name] # [phone], left voice message CSW is following patient and patient's room number.   Antony Blackbird, MSW, LCSWA Clinical Social Worker

## 2020-08-28 NOTE — Progress Notes (Addendum)
Progress Note  Patient Name: Jo Hays Date of Encounter: 08/28/2020  [facility] Cardiologist: Lance Muss, MD   Subjective   Says her breathing is better. Got up to the bedside commode and desat to 70%. Improved quickly after being placed back on O2  Inpatient Medications    Scheduled Meds: . aspirin EC  81 mg Oral Daily  . citalopram  20 mg Oral QHS  . enoxaparin (LOVENOX) injection  40 mg Subcutaneous Q24H  . furosemide  40 mg Intravenous BID  . insulin aspart  0-9 Units Subcutaneous TID WC  . isosorbide mononitrate  30 mg Oral Daily  . melatonin  10 mg Oral QHS  . metoprolol tartrate  25 mg Oral BID  . pravastatin  40 mg Oral q1800  . risperiDONE  0.5 mg Oral BID  . ticagrelor  90 mg Oral BID  . traZODone  75 mg Oral QHS   Continuous Infusions: . azithromycin 500 mg (08/27/20 2108)  . cefTRIAXone (ROCEPHIN)  IV 2 g (08/27/20 2026)   PRN Meds: ondansetron **OR** ondansetron (ZOFRAN) IV   Vital Signs    Vitals:   08/27/20 2323 08/28/20 0351 08/28/20 0614 08/28/20 0728  BP: 130/69 121/76  136/77  Pulse: 61 72  73  Resp: 17 14  16   Temp: 98.4 F (36.9 C) 98.3 F (36.8 C)  98.8 F (37.1 C)  TempSrc: Oral Oral  Oral  SpO2: 97% 93%  92%  Weight:   104.8 kg   Height:        Intake/Output Summary (Last 24 hours) at 08/28/2020 0909 Last data filed at 08/28/2020 0904 Gross per 24 hour  Intake 350 ml  Output 2710 ml  Net -2360 ml   Last 3 Weights 08/28/2020 08/26/2020  Weight (lbs) 231 lb 0.7 oz 244 lb 11.4 oz  Weight (kg) 104.8 kg 111 kg      Telemetry    SR with PVCs - Personally Reviewed  ECG    No new tracing this morning  Physical Exam  Older WF, laying in bed GEN: No acute distress.   Neck: + JVD Cardiac: RRR, no murmurs, rubs, or gallops.  Respiratory: Expiratory wheezing GI: Soft, nontender, non-distended  MS: 2+ pitting LE edema L>R; No deformity. Neuro:  Nonfocal  Psych: Normal affect   Labs    High Sensitivity  Troponin:   Recent Labs  Lab 08/26/20 1725 08/26/20 1923 08/26/20 2055 08/26/20 2300  TROPONINIHS 25* 28* 24* 29*      Chemistry Recent Labs  Lab 08/26/20 1725 08/26/20 1725 08/26/20 2055 08/27/20 0452 08/28/20 0447  NA 136  --   --  138 139  K 3.9  --   --  4.0 3.9  CL 98  --   --  101 100  CO2 27  --   --  27 29  GLUCOSE 217*  --   --  136* 121*  BUN 15  --   --  17 15  CREATININE 1.25*   < > 1.26* 1.20* 1.23*  CALCIUM 8.6*  --   --  8.1* 8.2*  PROT 6.8  --   --   --   --   ALBUMIN 2.8*  --   --   --   --   AST 18  --   --   --   --   ALT 22  --   --   --   --   ALKPHOS 64  --   --   --   --  BILITOT 0.5  --   --   --   --   GFRNONAA 45*   < > 45* 48* 46*  GFRAA 53*   < > 52* 55* 54*  ANIONGAP 11  --   --  10 10   < > = values in this interval not displayed.     Hematology Recent Labs  Lab 08/26/20 1725 08/26/20 2055 08/27/20 0452  WBC 10.0 10.4 11.1*  RBC 4.87 4.79 4.82  HGB 13.3 12.7 13.1  HCT 42.1 41.3 42.2  MCV 86.4 86.2 87.6  MCH 27.3 26.5 27.2  MCHC 31.6 30.8 31.0  RDW 15.5 15.1 15.4  PLT 238 230 210    BNP Recent Labs  Lab 08/26/20 1734  BNP 193.1*     DDimer  Recent Labs  Lab 08/27/20 1316  DDIMER 1.67*     Radiology    DG Chest 2 View  Result Date: 08/26/2020 CLINICAL DATA:  Chest pain, productive cough EXAM: CHEST - 2 VIEW COMPARISON:  04/17/2018 FINDINGS: Lung volumes are small. Focal interstitial infiltrate is seen within the lingula. No pneumothorax or pleural effusion. Cardiac size is within normal limits when accounting for poor pulmonary insufflation. No acute bone abnormality., IMPRESSION: Focal lingular infiltrate, likely infectious in the appropriate clinical setting. Electronically Signed   By: Helyn Numbers MD   On: 08/26/2020 18:04   ECHOCARDIOGRAM COMPLETE  Result Date: 08/27/2020    ECHOCARDIOGRAM REPORT   Patient Name:   Jo Hays Date of Exam: 08/27/2020 Medical Rec #:  [id number]      Height:       63.0 in  Accession #:    [id number]     Weight:       244.7 lb Date of Birth:  [birth year]      BSA:          2.107 m Patient Age:    64 years       BP:           136/74 mmHg Patient Gender: F              HR:           64 bpm. Exam Location:  Inpatient Procedure: 2D Echo, Color Doppler, Cardiac Doppler and Intracardiac            Opacification Agent Indications:    R06.9 DOE  History:        Patient has prior history of Echocardiogram examinations, most                 recent 04/18/2018. CAD; Risk Factors:Hypertension and                 Dyslipidemia. Prior performed at [facility].  Sonographer:    Irving Burton Senior RDCS Referring Phys: [phone] Eduard Clos  Sonographer Comments: Technically difficult study due to patient body habitus. IMPRESSIONS  1. Technically difficult echo with poor image quality. the valves were not visualized well.  2. Left ventricular ejection fraction, by estimation, is 60 to 65%. The left ventricle has normal function. The left ventricle has no regional wall motion abnormalities. Left ventricular diastolic parameters are consistent with Grade II diastolic dysfunction (pseudonormalization).  3. Right ventricular systolic function was not well visualized. The right ventricular size is not well visualized.  4. Right atrial size was mildly dilated.  5. The mitral valve was not well visualized. No evidence of mitral valve regurgitation.  6. The aortic valve was not well visualized. Aortic valve  regurgitation is not visualized. FINDINGS  Left Ventricle: Left ventricular ejection fraction, by estimation, is 60 to 65%. The left ventricle has normal function. The left ventricle has no regional wall motion abnormalities. Definity contrast agent was given IV to delineate the left ventricular  endocardial borders. The left ventricular internal cavity size was normal in size. There is no left ventricular hypertrophy. Left ventricular diastolic parameters are consistent with Grade II diastolic dysfunction  (pseudonormalization). Right Ventricle: The right ventricular size is not well visualized. Right vetricular wall thickness was not well visualized. Right ventricular systolic function was not well visualized. Left Atrium: Left atrial size was normal in size. Right Atrium: Right atrial size was mildly dilated. Pericardium: There is no evidence of pericardial effusion. Mitral Valve: The mitral valve was not well visualized. No evidence of mitral valve regurgitation. Tricuspid Valve: The tricuspid valve is not well visualized. Tricuspid valve regurgitation is not demonstrated. Aortic Valve: The aortic valve was not well visualized. Aortic valve regurgitation is not visualized. Pulmonic Valve: The pulmonic valve was not well visualized. Pulmonic valve regurgitation is not visualized. Aorta: The aortic root and ascending aorta are structurally normal, with no evidence of dilitation. IAS/Shunts: The atrial septum is grossly normal. Additional Comments: Technically difficult echo with poor image quality. the valves were not visualized well.  LEFT VENTRICLE PLAX 2D LVIDd:         5.20 cm  Diastology LVIDs:         4.50 cm  LV e' medial:    4.90 cm/s LV PW:         1.20 cm  LV E/e' medial:  21.4 LV IVS:        1.10 cm  LV e' lateral:   6.64 cm/s LVOT diam:     2.00 cm  LV E/e' lateral: 15.8 LV SV:         79 LV SV Index:   37 LVOT Area:     3.14 cm  RIGHT VENTRICLE RV S prime:     9.46 cm/s TAPSE (M-mode): 2.2 cm LEFT ATRIUM             Index       RIGHT ATRIUM           Index LA diam:        3.40 cm 1.61 cm/m  RA Area:     19.20 cm LA Vol (A2C):   40.0 ml 18.99 ml/m RA Volume:   58.00 ml  27.53 ml/m LA Vol (A4C):   54.9 ml 26.06 ml/m LA Biplane Vol: 50.0 ml 23.73 ml/m  AORTIC VALVE LVOT Vmax:   110.00 cm/s LVOT Vmean:  78.000 cm/s LVOT VTI:    0.250 m  AORTA Ao Root diam: 3.30 cm MV E velocity: 105.00 cm/s MV A velocity: 62.10 cm/s   SHUNTS MV E/A ratio:  1.69         Systemic VTI:  0.25 m                              Systemic Diam: 2.00 cm Kristeen Miss MD Electronically signed by Kristeen Miss MD Signature Date/Time: 08/27/2020/2:15:11 PM    Final     Cardiac Studies   Echo: 08/27/20  IMPRESSIONS    1. Technically difficult echo with poor image quality. the valves were  not visualized well.  2. Left ventricular ejection fraction, by estimation, is 60 to 65%. The  left ventricle has normal  function. The left ventricle has no regional  wall motion abnormalities. Left ventricular diastolic parameters are  consistent with Grade II diastolic  dysfunction (pseudonormalization).  3. Right ventricular systolic function was not well visualized. The right  ventricular size is not well visualized.  4. Right atrial size was mildly dilated.  5. The mitral valve was not well visualized. No evidence of mitral valve  regurgitation.  6. The aortic valve was not well visualized. Aortic valve regurgitation  is not visualized.   FINDINGS  Left Ventricle: Left ventricular ejection fraction, by estimation, is 60  to 65%. The left ventricle has normal function. The left ventricle has no  regional wall motion abnormalities. Definity contrast agent was given IV  to delineate the left ventricular  endocardial borders. The left ventricular internal cavity size was normal  in size. There is no left ventricular hypertrophy. Left ventricular  diastolic parameters are consistent with Grade II diastolic dysfunction  (pseudonormalization).   Right Ventricle: The right ventricular size is not well visualized. Right  vetricular wall thickness was not well visualized. Right ventricular  systolic function was not well visualized.   Left Atrium: Left atrial size was normal in size.   Right Atrium: Right atrial size was mildly dilated.   Pericardium: There is no evidence of pericardial effusion.   Mitral Valve: The mitral valve was not well visualized. No evidence of  mitral valve regurgitation.   Tricuspid  Valve: The tricuspid valve is not well visualized. Tricuspid  valve regurgitation is not demonstrated.   Aortic Valve: The aortic valve was not well visualized. Aortic valve  regurgitation is not visualized.   Pulmonic Valve: The pulmonic valve was not well visualized. Pulmonic valve  regurgitation is not visualized.   Aorta: The aortic root and ascending aorta are structurally normal, with  no evidence of dilitation.   IAS/Shunts: The atrial septum is grossly normal.   Additional Comments: Technically difficult echo with poor image quality.  the valves were not visualized well.   Patient Profile     64 y.o. female  with a hx of NSTEMI 04/2018 s/p DES RCA & LAD w/ PTCA D1, HTN, HLD, RBBB, Dementia, who was seen for the evaluation of CHF at the request of Dr Allena KatzPatel.  Assessment & Plan    1. Acute on Chronic CHF: echo with normal EF and G2DD, no rWMA noted. Net - 2.7L and weight trending down. Does report improvement in respiratory status today, though is still volume overloaded. Desat to 70% off O2, and LE edema noted.  -- continue with diuresis, suspect she will need at least 2 more days -- daily weights, I&Os  2. Chest pain: reports this is better today. hsTn with low flat non ACS trend. Echo with normal EF and no rWMA noted. Suspect this was moreso related to volume overload.  -- continue ASA, Brilinta, BB and statin   3. HTN: has elevated readings yesterday but improved this morning. Home lisinopril held while diuresing.   4. DM: Hgb a1c 8.7. On januvia prior to admission.  -- on SSI while inpatient   For questions or updates, please contact [facility] Please consult [email or web address] for contact info under        Signed, Laverda PageLindsay Roberts, NP  08/28/2020, 9:09 AM     I have examined the patient and reviewed assessment and plan and discussed with patient.  Agree with above as stated.    Diuresing well.  Cr stable. Normal EF.  RF modification needed including DM  control.    Lance Muss

## 2020-08-28 NOTE — Progress Notes (Signed)
[facility] Progress Note  Patient: Jo Hays    UVO:[id number]  DOA: 08/26/2020     Date of Service: the patient was seen and examined on 08/28/2020  Brief hospital course: Past medical history of CAD SP stenting, HTN, HLD, depression. Presents with complaints of chest pain.  Cardiology consulted.  Do not think the patient has an STEMI.  No further work-up recommended by cardiology for now.  Continuing diuresis.  Cardiology continues to follow. Currently plan is continue IV diuresis.  Assessment and Plan: 1.  Chest pain. Unstable angina ruled out. Echocardiogram shows normal EF no wall motion abnormality. Troponins are elevated but flat. Chest pain improving. Currently on aspirin Brilinta aspirin beta-blocker as well as statin. No intervention by cardiology recommended.  2.  Acute on chronic diastolic CHF Currently responding to IV diuresis. Continuing IV diuresis monitor renal function.  3.  Concern for community-acquired pneumonia. Chest x-ray shows lingular opacity. Currently on antibiotic.  Monitor.  4.  Depression. Developmental delay Patient has a legal guardian. Continue currently home regimen.  5.  Chronic kidney disease stage IIIa Renal function stable. Monitor while the patient is receiving IV diuresis.  6.  Morbid obesity Weight improving after diuresis. Dietary consulted. Body mass index is 40.93 kg/m.   Diet: Cardiac diet DVT Prophylaxis:   enoxaparin (LOVENOX) injection 40 mg Start: 08/26/20 2200    Advance goals of care discussion: Full code  Family Communication: no family was present at bedside, at the time of interview.   Disposition:  Status is: Inpatient  Remains inpatient appropriate because:Inpatient level of care appropriate due to severity of illness   Dispo: The patient is from: Home              Anticipated d/c is to: Home              Anticipated d/c date is: 2 days              Patient currently is not medically stable  to d/c.        Subjective: No nausea no vomiting.  Continues to have mild central chest pain.  Which is stabbing in nature.  Still has shortness of breath.  No cough.  Physical Exam:  General: Appear in mild distress, no Rash; Oral Mucosa Clear, moist. no Abnormal Neck Mass Or lumps, Conjunctiva normal  Cardiovascular: S1 and S2 Present, no Murmur, Respiratory: good respiratory effort, Bilateral Air entry present and bilateral Crackles, no wheezes Abdomen: Bowel Sound present, Soft and no tenderness Extremities: bilateral  Pedal edema Neurology: alert and oriented to time, place, and person affect appropriate. no new focal deficit Gait not checked due to patient safety concerns  Vitals:   08/28/20 0614 08/28/20 0728 08/28/20 1215 08/28/20 1608  BP:  136/77 (!) 117/93 129/73  Pulse:  73 64 78  Resp:  16 20 19   Temp:  98.8 F (37.1 C) 98.7 F (37.1 C) 98.2 F (36.8 C)  TempSrc:  Oral Oral Oral  SpO2:  92% 93%   Weight: 104.8 kg     Height:        Intake/Output Summary (Last 24 hours) at 08/28/2020 1800 Last data filed at 08/28/2020 1449 Gross per 24 hour  Intake 830 ml  Output 3000 ml  Net -2170 ml   Filed Weights   08/26/20 1819 08/28/20 0614  Weight: 111 kg 104.8 kg    Data Reviewed: I have personally reviewed and interpreted daily labs, tele strips, imagings as discussed above. I  reviewed all nursing notes, pharmacy notes, vitals, pertinent old records I have discussed plan of care as described above with RN and patient/family.  CBC: Recent Labs  Lab 08/26/20 1725 08/26/20 2055 08/27/20 0452  WBC 10.0 10.4 11.1*  NEUTROABS 7.6  --   --   HGB 13.3 12.7 13.1  HCT 42.1 41.3 42.2  MCV 86.4 86.2 87.6  PLT 238 230 210   Basic Metabolic Panel: Recent Labs  Lab 08/26/20 1725 08/26/20 2055 08/27/20 0452 08/28/20 0447  NA 136  --  138 139  K 3.9  --  4.0 3.9  CL 98  --  101 100  CO2 27  --  27 29  GLUCOSE 217*  --  136* 121*  BUN 15  --  17 15    CREATININE 1.25* 1.26* 1.20* 1.23*  CALCIUM 8.6*  --  8.1* 8.2*    Studies: No results found.  Scheduled Meds: . aspirin EC  81 mg Oral Daily  . citalopram  20 mg Oral QHS  . enoxaparin (LOVENOX) injection  40 mg Subcutaneous Q24H  . furosemide  40 mg Intravenous BID  . insulin aspart  0-9 Units Subcutaneous TID WC  . isosorbide mononitrate  30 mg Oral Daily  . melatonin  10 mg Oral QHS  . metoprolol tartrate  25 mg Oral BID  . pravastatin  40 mg Oral q1800  . risperiDONE  0.5 mg Oral BID  . ticagrelor  90 mg Oral BID  . traZODone  75 mg Oral QHS   Continuous Infusions: . azithromycin 500 mg (08/27/20 2108)  . cefTRIAXone (ROCEPHIN)  IV 2 g (08/27/20 2026)   PRN Meds: ondansetron **OR** ondansetron (ZOFRAN) IV  Time spent: 35 minutes  Author: Lynden Oxford, MD [facility] 08/28/2020 6:00 PM  To reach On-call, see care teams to locate the attending and reach out via [email or web address]. Between 7PM-7AM, please contact night-coverage If you still have difficulty reaching the attending provider, please page the [facility] (Director on Call) for [facility] on amion for assistance.

## 2020-08-28 NOTE — Plan of Care (Signed)

## 2020-08-29 DIAGNOSIS — E8779 Other fluid overload: Secondary | ICD-10-CM

## 2020-08-29 LAB — BASIC METABOLIC PANEL
Anion gap: 9 (ref 5–15)
BUN: 15 mg/dL (ref 8–23)
CO2: 35 mmol/L — ABNORMAL HIGH (ref 22–32)
Calcium: 8.5 mg/dL — ABNORMAL LOW (ref 8.9–10.3)
Chloride: 95 mmol/L — ABNORMAL LOW (ref 98–111)
Creatinine, Ser: 1.16 mg/dL — ABNORMAL HIGH (ref 0.44–1.00)
GFR calc Af Amer: 58 mL/min — ABNORMAL LOW (ref >60)
GFR calc non Af Amer: 50 mL/min — ABNORMAL LOW (ref >60)
Glucose, Bld: 129 mg/dL — ABNORMAL HIGH (ref 70–99)
Potassium: 3.6 mmol/L (ref 3.5–5.1)
Sodium: 139 mmol/L (ref 135–145)

## 2020-08-29 LAB — GLUCOSE, CAPILLARY
Glucose-Capillary: 143 mg/dL — ABNORMAL HIGH (ref 70–99)
Glucose-Capillary: 152 mg/dL — ABNORMAL HIGH (ref 70–99)
Glucose-Capillary: 129 mg/dL — ABNORMAL HIGH (ref 70–99)
Glucose-Capillary: 121 mg/dL — ABNORMAL HIGH (ref 70–99)

## 2020-08-29 LAB — LIPID PANEL
Cholesterol: 143 mg/dL (ref 0–200)
HDL: 38 mg/dL — ABNORMAL LOW (ref >40)
LDL Cholesterol: 83 mg/dL (ref 0–99)
Total CHOL/HDL Ratio: 3.8 RATIO
Triglycerides: 108 mg/dL (ref <150)
VLDL: 22 mg/dL (ref 0–40)

## 2020-08-29 MED ORDER — ACETAMINOPHEN 325 MG PO TABS
650.0000 mg | ORAL_TABLET | Freq: Four times a day (QID) | ORAL | Status: DC | PRN
  Administered 2020-08-29 – 2020-09-03 (×3): 650 mg via ORAL
  Filled 2020-08-29 (×3): qty 2

## 2020-08-29 MED ORDER — IPRATROPIUM-ALBUTEROL 0.5-2.5 (3) MG/3ML IN SOLN: 3.0000 mL | Freq: Four times a day (QID) | RESPIRATORY_TRACT | Status: DC | PRN

## 2020-08-29 MED ORDER — METOPROLOL TARTRATE 50 MG PO TABS
50.0000 mg | ORAL_TABLET | Freq: Two times a day (BID) | ORAL | Status: DC
  Administered 2020-08-29 – 2020-08-31 (×5): 50 mg via ORAL
  Filled 2020-08-29 (×5): qty 1

## 2020-08-29 NOTE — Progress Notes (Addendum)
Progress Note  Patient Name: Jo Hays Date of Encounter: 08/29/2020  [facility] Cardiologist: Cardiology f/u in [place] with : Hillery Jacks, NP  Subjective   Pt resting inclined, answers questions.   Inpatient Medications    Scheduled Meds: . aspirin EC  81 mg Oral Daily  . citalopram  20 mg Oral QHS  . enoxaparin (LOVENOX) injection  40 mg Subcutaneous Q24H  . furosemide  40 mg Intravenous BID  . insulin aspart  0-9 Units Subcutaneous TID WC  . isosorbide mononitrate  30 mg Oral Daily  . melatonin  10 mg Oral QHS  . metoprolol tartrate  50 mg Oral BID  . pravastatin  40 mg Oral q1800  . risperiDONE  0.5 mg Oral BID  . ticagrelor  90 mg Oral BID  . traZODone  75 mg Oral QHS   Continuous Infusions: . azithromycin 500 mg (08/28/20 2111)  . cefTRIAXone (ROCEPHIN)  IV 2 g (08/28/20 2008)   PRN Meds: ondansetron **OR** ondansetron (ZOFRAN) IV   Vital Signs    Vitals:   08/28/20 2350 08/29/20 0336 08/29/20 0421 08/29/20 0753  BP: (!) 146/66 (!) 159/68  (!) 185/92  Pulse: 69 80 79 80  Resp: 14 15 19 20   Temp: 98.6 F (37 C) 98.6 F (37 C)  98.4 F (36.9 C)  TempSrc: Oral Oral  Oral  SpO2: 95% 92% 92% 94%  Weight:   102.8 kg   Height:        Intake/Output Summary (Last 24 hours) at 08/29/2020 0858 Last data filed at 08/29/2020 0647 Gross per 24 hour  Intake 1070 ml  Output 3700 ml  Net -2630 ml   Last 3 Weights 08/29/2020 08/28/2020 08/26/2020  Weight (lbs) 226 lb 10.1 oz 231 lb 0.7 oz 244 lb 11.4 oz  Weight (kg) 102.8 kg 104.8 kg 111 kg      Telemetry    Sinus HR 70-80s, frequent PVCs - Personally Reviewed  ECG    No new tracings - Personally Reviewed  Physical Exam   GEN: No acute distress.   Neck: + JVD. Exam difficult  Cardiac: RRR, no murmurs, rubs, or gallops.  Respiratory: respirations unlabored, diminished in bases GI: Soft, nontender, non-distended  MS: 1+ B LE edema Neuro:  Nonfocal  Psych: Normal affect    Labs    High Sensitivity Troponin:   Recent Labs  Lab 08/26/20 1725 08/26/20 1923 08/26/20 2055 08/26/20 2300  TROPONINIHS 25* 28* 24* 29*      Chemistry Recent Labs  Lab 08/26/20 1725 08/26/20 2055 08/27/20 0452 08/28/20 0447 08/29/20 0217  NA 136   < > 138 139 139  K 3.9   < > 4.0 3.9 3.6  CL 98   < > 101 100 95*  CO2 27   < > 27 29 35*  GLUCOSE 217*   < > 136* 121* 129*  BUN 15   < > 17 15 15   CREATININE 1.25*   < > 1.20* 1.23* 1.16*  CALCIUM 8.6*   < > 8.1* 8.2* 8.5*  PROT 6.8  --   --   --   --   ALBUMIN 2.8*  --   --   --   --   AST 18  --   --   --   --   ALT 22  --   --   --   --   ALKPHOS 64  --   --   --   --  BILITOT 0.5  --   --   --   --   GFRNONAA 45*   < > 48* 46* 50*  GFRAA 53*   < > 55* 54* 58*  ANIONGAP 11   < > 10 10 9    < > = values in this interval not displayed.     Hematology Recent Labs  Lab 08/26/20 1725 08/26/20 2055 08/27/20 0452  WBC 10.0 10.4 11.1*  RBC 4.87 4.79 4.82  HGB 13.3 12.7 13.1  HCT 42.1 41.3 42.2  MCV 86.4 86.2 87.6  MCH 27.3 26.5 27.2  MCHC 31.6 30.8 31.0  RDW 15.5 15.1 15.4  PLT 238 230 210    BNP Recent Labs  Lab 08/26/20 1734  BNP 193.1*     DDimer  Recent Labs  Lab 08/27/20 1316  DDIMER 1.67*     Radiology    ECHOCARDIOGRAM COMPLETE  Result Date: 08/27/2020    ECHOCARDIOGRAM REPORT   Patient Name:   Jo Hays Date of Exam: 08/27/2020 Medical Rec #:  08/29/2020      Height:       63.0 in Accession #:    [id number]     Weight:       244.7 lb Date of Birth:  Apr 10, 1956      BSA:          2.107 m Patient Age:    64 years       BP:           136/74 mmHg Patient Gender: F              HR:           64 bpm. Exam Location:  Inpatient Procedure: 2D Echo, Color Doppler, Cardiac Doppler and Intracardiac            Opacification Agent Indications:    R06.9 DOE  History:        Patient has prior history of Echocardiogram examinations, most                 recent 04/18/2018. CAD; Risk Factors:Hypertension  and                 Dyslipidemia. Prior performed at [facility].  Sonographer:    [facility] Senior RDCS Referring Phys: [phone]  Sonographer Comments: Technically difficult study due to patient body habitus. IMPRESSIONS  1. Technically difficult echo with poor image quality. the valves were not visualized well.  2. Left ventricular ejection fraction, by estimation, is 60 to 65%. The left ventricle has normal function. The left ventricle has no regional wall motion abnormalities. Left ventricular diastolic parameters are consistent with Grade II diastolic dysfunction (pseudonormalization).  3. Right ventricular systolic function was not well visualized. The right ventricular size is not well visualized.  4. Right atrial size was mildly dilated.  5. The mitral valve was not well visualized. No evidence of mitral valve regurgitation.  6. The aortic valve was not well visualized. Aortic valve regurgitation is not visualized. FINDINGS  Left Ventricle: Left ventricular ejection fraction, by estimation, is 60 to 65%. The left ventricle has normal function. The left ventricle has no regional wall motion abnormalities. Definity contrast agent was given IV to delineate the left ventricular  endocardial borders. The left ventricular internal cavity size was normal in size. There is no left ventricular hypertrophy. Left ventricular diastolic parameters are consistent with Grade II diastolic dysfunction (pseudonormalization). Right Ventricle: The right ventricular size is not well visualized.  Right vetricular wall thickness was not well visualized. Right ventricular systolic function was not well visualized. Left Atrium: Left atrial size was normal in size. Right Atrium: Right atrial size was mildly dilated. Pericardium: There is no evidence of pericardial effusion. Mitral Valve: The mitral valve was not well visualized. No evidence of mitral valve regurgitation. Tricuspid Valve: The tricuspid valve is not well  visualized. Tricuspid valve regurgitation is not demonstrated. Aortic Valve: The aortic valve was not well visualized. Aortic valve regurgitation is not visualized. Pulmonic Valve: The pulmonic valve was not well visualized. Pulmonic valve regurgitation is not visualized. Aorta: The aortic root and ascending aorta are structurally normal, with no evidence of dilitation. IAS/Shunts: The atrial septum is grossly normal. Additional Comments: Technically difficult echo with poor image quality. the valves were not visualized well.  LEFT VENTRICLE PLAX 2D LVIDd:         5.20 cm  Diastology LVIDs:         4.50 cm  LV e' medial:    4.90 cm/s LV PW:         1.20 cm  LV E/e' medial:  21.4 LV IVS:        1.10 cm  LV e' lateral:   6.64 cm/s LVOT diam:     2.00 cm  LV E/e' lateral: 15.8 LV SV:         79 LV SV Index:   37 LVOT Area:     3.14 cm  RIGHT VENTRICLE RV S prime:     9.46 cm/s TAPSE (M-mode): 2.2 cm LEFT ATRIUM             Index       RIGHT ATRIUM           Index LA diam:        3.40 cm 1.61 cm/m  RA Area:     19.20 cm LA Vol (A2C):   40.0 ml 18.99 ml/m RA Volume:   58.00 ml  27.53 ml/m LA Vol (A4C):   54.9 ml 26.06 ml/m LA Biplane Vol: 50.0 ml 23.73 ml/m  AORTIC VALVE LVOT Vmax:   110.00 cm/s LVOT Vmean:  78.000 cm/s LVOT VTI:    0.250 m  AORTA Ao Root diam: 3.30 cm MV E velocity: 105.00 cm/s MV A velocity: 62.10 cm/s   SHUNTS MV E/A ratio:  1.69         Systemic VTI:  0.25 m                             Systemic Diam: 2.00 cm Kristeen Miss MD Electronically signed by Kristeen Miss MD Signature Date/Time: 08/27/2020/2:15:11 PM    Final     Cardiac Studies   Echo 08/27/20: 1. Technically difficult echo with poor image quality. the valves were  not visualized well.  2. Left ventricular ejection fraction, by estimation, is 60 to 65%. The  left ventricle has normal function. The left ventricle has no regional  wall motion abnormalities. Left ventricular diastolic parameters are  consistent with Grade II  diastolic  dysfunction (pseudonormalization).  3. Right ventricular systolic function was not well visualized. The right  ventricular size is not well visualized.  4. Right atrial size was mildly dilated.  5. The mitral valve was not well visualized. No evidence of mitral valve  regurgitation.  6. The aortic valve was not well visualized. Aortic valve regurgitation  is not visualized.   Patient Profile  64 y.o. female with a hx of NSTEMI 04/2018 s/p DES RCA & LAD w/ PTCA D1, HTN, HLD, RBBB, Dementia, who is being seen for the evaluation of CHF.  Assessment & Plan    Acute on chronic diastolic heart failure - echo this admission with preserved EF and grade 2 DD - diuresing well on 40 mg IV lasix BID - overall net negative 5L with 3.7 L urine output yesterday - weight is 226 lbs from 244 on admission - BNP was elevated to 200 - agree with another day of IV lasix, likely transition to PO in the next 24-48 hrs   Chest pain Elevated troponin - DES-RCA, DES-LAD, PTCA-D1 - CP improved as BP was controlled - HS troponin mild and flat consistent with demand ischemia - continue ASA, brilinta, BB, and statin - last PCI was 2017, consider reducing brilinta to 60 mg BID   Hypertension - BB and ACEI - significantly elevated this morning - follow after morning medications given   DM - uncontrolled - A1c 8.7% - SSI - per primary      For questions or updates, please contact [facility] Please consult [email or web address] for contact info under        Signed, Marcelino Dusterngela Nicole Duke, PA  08/29/2020, 8:58 AM    I have examined the patient and reviewed assessment and plan and discussed with patient.  Agree with above as stated.  Reports back pain.  Wean oxygen Continue diuresis as noted above.  LE edema improved today. BP control Salt restriction and DM control going forward. Cardiology f[facility]u in [facility] with : Hillery JacksBrandy Lanette Younts-Davis, NP; last seen in June  2021    Lance MussJayadeep Inas [address]ven[address]

## 2020-08-29 NOTE — Progress Notes (Signed)
[facility] PROGRESS NOTE    Jo Hays  OZY:[id number] DOB: [birth year] DOA: 08/26/2020 PCP: Patient, No Pcp Per      Brief Narrative:  Jo Hays is a 64 y.o. F with obesity, congenital cognitive delay, DM, HTN and CAD who presented with chest pain.  In the ER, found to have BP 200 systolic, CXR with bilateral infiltrates, BNP elevation, and so was started on diuretics and admitted.       Assessment & Plan:  Acute on chronic diastolic CHF Hypertension with hypetensive urgency Patient is diuresed a total of 6 L so far, net -2 L yesterday, creatinine improving -Continue Furosemide IV   -K supplement -Strict I/Os, daily weights, telemetry  -Daily monitoring renal function -Consult Cardiology appreciate cares    Possible community-acquired pneumonia -Continue azithromycin and ceftriaxone  Coronary disease, secondary prevention ACS ruled out Presented with chest pain -Continue aspirin, Brilinta -Continue metoprolol -Continue Imdur, pravastatin  Developmental delay Depression -Continue risperidone, citalopram  Chronic kidney disease stage IIIa Creatinine stable relative to baseline  MOrbid obesity BMI 40.1        Disposition: Status is: Inpatient  Remains inpatient appropriate because:IV treatments appropriate due to intensity of illness or inability to take PO   Dispo: The patient is from: Home              Anticipated d/c is to: Group home              Anticipated d/c date is: 1 day              Patient currently is not medically stable to d/c.     Is admitted with hypertensive urgency and CHF flare.  She is improving, her CHF is near resolved.  Possibly home in 24 to 48 hours.        MDM: The below labs and imaging reports were reviewed and summarized above.  Medication management as above.     DVT prophylaxis: enoxaparin (LOVENOX) injection 40 mg Start: 08/26/20 2200  Code Status: FULl Family Communication:              Subjective: She is still very short of breath with exertion.  Her swelling is improved.  No fever, sputum.  Objective: Vitals:   08/29/20 0336 08/29/20 0421 08/29/20 0753 08/29/20 1108  BP: (!) 159/68  (!) 185/92 110/60  Pulse: 80 79 80 (!) 54  Resp: 15 19 20  (!) 21  Temp: 98.6 F (37 C)  98.4 F (36.9 C) 98 F (36.7 C)  TempSrc: Oral  Oral Oral  SpO2: 92% 92% 94% 94%  Weight:  102.8 kg    Height:        Intake/Output Summary (Last 24 hours) at 08/29/2020 1347 Last data filed at 08/29/2020 1231 Gross per 24 hour  Intake 830 ml  Output 4300 ml  Net -3470 ml   Filed Weights   08/26/20 1819 08/28/20 0614 08/29/20 0421  Weight: 111 kg 104.8 kg 102.8 kg    Examination: General appearance:  adult female, alert and in no acute distress.   HEENT: Anicteric, conjunctiva pink, lids and lashes normal. No nasal deformity, discharge, epistaxis.  Lips moist.   Skin: Warm and dry.  No jaundice.  No suspicious rashes or lesions. Cardiac: RRR, nl S1-S2, no murmurs appreciated.  Capillary refill is brisk.  JVP not visible.  Trace LE edema.  Radial  pulses 2+ and symmetric. Respiratory: Normal respiratory rate and rhythm.  Wheezing bilaterally.  Abdomen: Abdomen soft.  No TTP or guarding. No ascites, distension, hepatosplenomegaly.   MSK: No deformities or effusions. Neuro: Awake and alert.  EOMI, moves all extremities. Speech fluent.    Psych: Sensorium intact and responding to questions, attention normal. Affect normal.  Judgment and insight appear moderately impaired.    Data Reviewed: I have personally reviewed following labs and imaging studies:  CBC: Recent Labs  Lab 08/26/20 1725 08/26/20 2055 08/27/20 0452  WBC 10.0 10.4 11.1*  NEUTROABS 7.6  --   --   HGB 13.3 12.7 13.1  HCT 42.1 41.3 42.2  MCV 86.4 86.2 87.6  PLT 238 230 210   Basic Metabolic Panel: Recent Labs  Lab 08/26/20 1725 08/26/20 2055 08/27/20 0452 08/28/20 0447 08/29/20 0217   NA 136  --  138 139 139  K 3.9  --  4.0 3.9 3.6  CL 98  --  101 100 95*  CO2 27  --  27 29 35*  GLUCOSE 217*  --  136* 121* 129*  BUN 15  --  17 15 15   CREATININE 1.25* 1.26* 1.20* 1.23* 1.16*  CALCIUM 8.6*  --  8.1* 8.2* 8.5*   GFR: Estimated Creatinine Clearance: 56.2 mL/min (A) (by C-G formula based on SCr of 1.16 mg/dL (H)). Liver Function Tests: Recent Labs  Lab 08/26/20 1725  AST 18  ALT 22  ALKPHOS 64  BILITOT 0.5  PROT 6.8  ALBUMIN 2.8*   No results for input(s): LIPASE, AMYLASE in the last 168 hours. No results for input(s): AMMONIA in the last 168 hours. Coagulation Profile: No results for input(s): INR, PROTIME in the last 168 hours. Cardiac Enzymes: No results for input(s): CKTOTAL, CKMB, CKMBINDEX, TROPONINI in the last 168 hours. BNP (last 3 results) No results for input(s): PROBNP in the last 8760 hours. HbA1C: Recent Labs    08/27/20 0452  HGBA1C 8.7*   CBG: Recent Labs  Lab 08/28/20 1111 08/28/20 1610 08/28/20 2110 08/29/20 0643 08/29/20 1110  GLUCAP 159* 177* 106* 143* 152*   Lipid Profile: Recent Labs    08/29/20 0217  CHOL 143  HDL 38*  LDLCALC 83  TRIG 08/31/20  CHOLHDL 3.8   Thyroid Function Tests: Recent Labs    08/26/20 2056  TSH 2.413   Anemia Panel: No results for input(s): VITAMINB12, FOLATE, FERRITIN, TIBC, IRON, RETICCTPCT in the last 72 hours. Urine analysis:    Component Value Date/Time   COLORURINE YELLOW 08/26/2020 2143   APPEARANCEUR CLOUDY (A) 08/26/2020 2143   LABSPEC 1.009 08/26/2020 2143   PHURINE 7.0 08/26/2020 2143   GLUCOSEU 50 (A) 08/26/2020 2143   HGBUR NEGATIVE 08/26/2020 2143   BILIRUBINUR NEGATIVE 08/26/2020 2143   KETONESUR NEGATIVE 08/26/2020 2143   PROTEINUR >=300 (A) 08/26/2020 2143   NITRITE NEGATIVE 08/26/2020 2143   LEUKOCYTESUR NEGATIVE 08/26/2020 2143   Sepsis Labs: @LABRCNTIP (procalcitonin:4,lacticacidven:4)  ) Recent Results (from the past 240 hour(s))  SARS Coronavirus 2 by RT  PCR (hospital order, performed in [facility] hospital lab) Nasopharyngeal Nasopharyngeal Swab     Status: None   Collection Time: 08/26/20  7:23 PM   Specimen: Nasopharyngeal Swab  Result Value Ref Range Status   SARS Coronavirus 2 NEGATIVE NEGATIVE Final    Comment: (NOTE) SARS-CoV-2 target nucleic acids are NOT DETECTED.  The SARS-CoV-2 RNA is generally detectable in upper and lower respiratory specimens during the acute phase of infection. The lowest concentration of SARS-CoV-2 viral copies this assay can detect is 250 copies / mL. A negative result  does not preclude SARS-CoV-2 infection and should not be used as the sole basis for treatment or other patient management decisions.  A negative result may occur with improper specimen collection / handling, submission of specimen other than nasopharyngeal swab, presence of viral mutation(s) within the areas targeted by this assay, and inadequate number of viral copies (<250 copies / mL). A negative result must be combined with clinical observations, patient history, and epidemiological information.  Fact Sheet for Patients:   BoilerBrush.com.cy  Fact Sheet for Healthcare Providers: https://pope.com/  This test is not yet approved or  cleared by the [place] FDA and has been authorized for detection and/or diagnosis of SARS-CoV-2 by FDA under an Emergency Use Authorization (EUA).  This EUA will remain in effect (meaning this test can be used) for the duration of the COVID-19 declaration under Section 564(b)(1) of the Act, 21 U.S.C. section 360bbb-3(b)(1), unless the authorization is terminated or revoked sooner.  Performed at [facility], [address], [place], [place] [address]   MRSA PCR Screening     Status: None   Collection Time: 08/28/20  5:56 PM   Specimen: Nasal Mucosa; Nasopharyngeal  Result Value Ref Range Status   MRSA by PCR NEGATIVE NEGATIVE Final     Comment:        The GeneXpert MRSA Assay (FDA approved for NASAL specimens only), is one component of a comprehensive MRSA colonization surveillance program. It is not intended to diagnose MRSA infection nor to guide or monitor treatment for MRSA infections. Performed at [facility], [address] [place], [place] [address]          Radiology Studies: No results found.      Scheduled Meds: . aspirin EC  81 mg Oral Daily  . citalopram  20 mg Oral QHS  . enoxaparin (LOVENOX) injection  40 mg Subcutaneous Q24H  . furosemide  40 mg Intravenous BID  . insulin aspart  0-9 Units Subcutaneous TID WC  . isosorbide mononitrate  30 mg Oral Daily  . melatonin  10 mg Oral QHS  . metoprolol tartrate  50 mg Oral BID  . pravastatin  40 mg Oral q1800  . risperiDONE  0.5 mg Oral BID  . ticagrelor  90 mg Oral BID  . traZODone  75 mg Oral QHS   Continuous Infusions: . azithromycin 500 mg (08/28/20 2111)  . cefTRIAXone (ROCEPHIN)  IV 2 g (08/28/20 2008)     LOS: 3 days    Time spent: 25 minutes    Alberteen Sam, MD [facility] 08/29/2020, 1:47 PM     Please page though AMION or Epic secure chat:  For [employer], Higher education careers adviser

## 2020-08-29 NOTE — Progress Notes (Signed)
Central Monitoring called at 08-28-20 23:29 to tell me patient had 12 beats WCT at 22:59 check on patient and she is sleeping .

## 2020-08-30 DIAGNOSIS — I5033 Acute on chronic diastolic (congestive) heart failure: Secondary | ICD-10-CM

## 2020-08-30 DIAGNOSIS — I10 Essential (primary) hypertension: Secondary | ICD-10-CM

## 2020-08-30 LAB — BASIC METABOLIC PANEL
Anion gap: 10 (ref 5–15)
BUN: 15 mg/dL (ref 8–23)
CO2: 33 mmol/L — ABNORMAL HIGH (ref 22–32)
Calcium: 8.7 mg/dL — ABNORMAL LOW (ref 8.9–10.3)
Chloride: 96 mmol/L — ABNORMAL LOW (ref 98–111)
Creatinine, Ser: 1.06 mg/dL — ABNORMAL HIGH (ref 0.44–1.00)
GFR calc Af Amer: >60 mL/min (ref >60)
GFR calc non Af Amer: 55 mL/min — ABNORMAL LOW (ref >60)
Glucose, Bld: 121 mg/dL — ABNORMAL HIGH (ref 70–99)
Potassium: 3.9 mmol/L (ref 3.5–5.1)
Sodium: 139 mmol/L (ref 135–145)

## 2020-08-30 LAB — GLUCOSE, CAPILLARY
Glucose-Capillary: 137 mg/dL — ABNORMAL HIGH (ref 70–99)
Glucose-Capillary: 179 mg/dL — ABNORMAL HIGH (ref 70–99)
Glucose-Capillary: 84 mg/dL (ref 70–99)
Glucose-Capillary: 173 mg/dL — ABNORMAL HIGH (ref 70–99)

## 2020-08-30 MED ORDER — LISINOPRIL 10 MG PO TABS
20.0000 mg | ORAL_TABLET | Freq: Every day | ORAL | Status: DC
  Administered 2020-08-30 – 2020-09-03 (×5): 20 mg via ORAL
  Filled 2020-08-30 (×5): qty 2

## 2020-08-30 MED ORDER — FUROSEMIDE 10 MG/ML IJ SOLN
60.0000 mg | Freq: Two times a day (BID) | INTRAMUSCULAR | Status: DC
  Administered 2020-08-30 – 2020-09-01 (×4): 60 mg via INTRAVENOUS
  Filled 2020-08-30 (×4): qty 6

## 2020-08-30 NOTE — Progress Notes (Signed)
PROGRESS NOTE    Jo Hays  HYW:[id number] DOB: 1956/11/19 DOA: 08/26/2020 PCP: Patient, No Pcp Per   Brief Narrative: 64 y.o. Female with PMH of  obesity, congenital cognitive delay, DM, HTN and CAD who presented with chest pain. In the ER, she was found to have BP 200 systolic, CXR with bilateral infiltrates, BNP elevation, and so was started on diuretics and admitted for Acute on chronic CHF exacerbation.  So far she is improving and diuresed adequately with lasix.  HTN has improved.   Assessment & Plan:   Principal Problem:   Unstable angina (HCC) Active Problems:   Acute respiratory failure (HCC)   CAD (coronary artery disease)   Hypertensive urgency   Acute on chronic diastolic CHF Hypertension with hypetensive urgency - Patient is diuresed for a total of 6 L so far, net -2 L until 9/17, creatinine improving. - Echo 9/21: LVEF 60-65%, grade II Diastolic dysfunction -Continue Furosemide IV   -K supplementation. -Strict I/Os, daily weights, telemetry  -Daily monitoring renal function. -Cardiology consulted, recommended increasing lasix to 60 BID.     Possible community-acquired pneumonia. -Continue azithromycin and ceftriaxone. -She is on 2 L supplemental oxygen saturating 94%.  Coronary disease, secondary prevention ACS ruled out. Presented with chest pain which is resolved -Continue aspirin, Brilinta. -Continue metoprolol -Continue Imdur, pravastatin  Developmental delay Depression: -Continue risperidone, citalopram  Chronic kidney disease stage IIIa Creatinine stable,  at baseline.  Morbid obesity BMI 40.1    DVT prophylaxis:  Lovenox. Code Status: Full Family Communication: Discussed with patient. Disposition Plan:  Status is: Inpatient  Remains inpatient appropriate because:IV treatments appropriate due to intensity of illness or inability to take PO and Inpatient level of care appropriate due to severity of illness   Dispo: The  patient is from: Home              Anticipated d/c is to: Group home              Anticipated d/c date is: 1 day              Patient currently is not medically stable to d/c.  Consultants:   Cardiology.  Procedures: None. Antimicrobials:  Anti-infectives (From admission, onward)   Start     Dose/Rate Route Frequency Ordered Stop   08/27/20 2000  cefTRIAXone (ROCEPHIN) 2 g in sodium chloride 0.9 % 100 mL IVPB        2 g 200 mL/hr over 30 Minutes Intravenous Every 24 hours 08/26/20 2100 09/01/20 1959   08/27/20 2000  azithromycin (ZITHROMAX) 500 mg in sodium chloride 0.9 % 250 mL IVPB        500 mg 250 mL/hr over 60 Minutes Intravenous Every 24 hours 08/26/20 2100 09/01/20 1959   08/26/20 2015  cefTRIAXone (ROCEPHIN) 1 g in sodium chloride 0.9 % 100 mL IVPB        1 g 200 mL/hr over 30 Minutes Intravenous  Once 08/26/20 2005 08/26/20 2121   08/26/20 2015  azithromycin (ZITHROMAX) 500 mg in sodium chloride 0.9 % 250 mL IVPB        500 mg 250 mL/hr over 60 Minutes Intravenous  Once 08/26/20 2005 08/26/20 2200      Subjective: Patient was seen and examined at bedside.  Overnight events noted.  She reports feeling better,  She still complains of having back and hip pain.  She is on 2 L supplemental oxygen saturating 94%.  She still has 2+ pitting edema.  Objective: Vitals:  08/30/20 0201 08/30/20 0616 08/30/20 0857 08/30/20 1340  BP: (!) 143/96 (!) 154/97 (!) 143/76 124/71  Pulse: 77 71 66 (!) 51  Resp: 20 18  17   Temp: 98.4 F (36.9 C) 98.4 F (36.9 C) 97.8 F (36.6 C) 97.7 F (36.5 C)  TempSrc: Oral Oral Oral Oral  SpO2: 95% 95% 95% 96%  Weight:  103.7 kg    Height:        Intake/Output Summary (Last 24 hours) at 08/30/2020 1609 Last data filed at 08/30/2020 1300 Gross per 24 hour  Intake 480 ml  Output 300 ml  Net 180 ml   Filed Weights   08/28/20 0614 08/29/20 0421 08/30/20 0616  Weight: 104.8 kg 102.8 kg 103.7 kg    Examination:  General exam: Appears calm  and comfortable. Alert, oriented x1  Respiratory system: Clear to auscultation. Respiratory effort normal. Cardiovascular system: S1 & S2 heard, RRR. No JVD, murmurs, rubs, gallops or clicks. No pedal edema. Gastrointestinal system: Abdomen is nondistended, soft and nontender. No organomegaly or masses felt.  Normal bowel sounds heard. Central nervous system: Alert and oriented. No focal neurological deficits. Extremities: Pitting edema ++, No clubbing, No cyanosis. Skin: No rashes, lesions or ulcers Psychiatry: Judgement and insight appear normal. Mood & affect appropriate.     Data Reviewed: I have personally reviewed following labs and imaging studies  CBC: Recent Labs  Lab 08/26/20 1725 08/26/20 2055 08/27/20 0452  WBC 10.0 10.4 11.1*  NEUTROABS 7.6  --   --   HGB 13.3 12.7 13.1  HCT 42.1 41.3 42.2  MCV 86.4 86.2 87.6  PLT 238 230 210   Basic Metabolic Panel: Recent Labs  Lab 08/26/20 1725 08/26/20 1725 08/26/20 2055 08/27/20 0452 08/28/20 0447 08/29/20 0217 08/30/20 0147  NA 136  --   --  138 139 139 139  K 3.9  --   --  4.0 3.9 3.6 3.9  CL 98  --   --  101 100 95* 96*  CO2 27  --   --  27 29 35* 33*  GLUCOSE 217*  --   --  136* 121* 129* 121*  BUN 15  --   --  17 15 15 15   CREATININE 1.25*   < > 1.26* 1.20* 1.23* 1.16* 1.06*  CALCIUM 8.6*  --   --  8.1* 8.2* 8.5* 8.7*   < > = values in this interval not displayed.   GFR: Estimated Creatinine Clearance: 61.7 mL/min (A) (by C-G formula based on SCr of 1.06 mg/dL (H)). Liver Function Tests: Recent Labs  Lab 08/26/20 1725  AST 18  ALT 22  ALKPHOS 64  BILITOT 0.5  PROT 6.8  ALBUMIN 2.8*   No results for input(s): LIPASE, AMYLASE in the last 168 hours. No results for input(s): AMMONIA in the last 168 hours. Coagulation Profile: No results for input(s): INR, PROTIME in the last 168 hours. Cardiac Enzymes: No results for input(s): CKTOTAL, CKMB, CKMBINDEX, TROPONINI in the last 168 hours. BNP (last 3  results) No results for input(s): PROBNP in the last 8760 hours. HbA1C: No results for input(s): HGBA1C in the last 72 hours. CBG: Recent Labs  Lab 08/29/20 1110 08/29/20 1610 08/29/20 2122 08/30/20 0618 08/30/20 1329  GLUCAP 152* 129* 121* 137* 179*   Lipid Profile: Recent Labs    08/29/20 0217  CHOL 143  HDL 38*  LDLCALC 83  TRIG 09/01/20  CHOLHDL 3.8   Thyroid Function Tests: No results for input(s): TSH, T4TOTAL, FREET4,  T3FREE, THYROIDAB in the last 72 hours. Anemia Panel: No results for input(s): VITAMINB12, FOLATE, FERRITIN, TIBC, IRON, RETICCTPCT in the last 72 hours. Sepsis Labs: Recent Labs  Lab 08/26/20 2150  PROCALCITON <0.10    Recent Results (from the past 240 hour(s))  SARS Coronavirus 2 by RT PCR (hospital order, performed in [facility] hospital lab) Nasopharyngeal Nasopharyngeal Swab     Status: None   Collection Time: 08/26/20  7:23 PM   Specimen: Nasopharyngeal Swab  Result Value Ref Range Status   SARS Coronavirus 2 NEGATIVE NEGATIVE Final    Comment: (NOTE) SARS-CoV-2 target nucleic acids are NOT DETECTED.  The SARS-CoV-2 RNA is generally detectable in upper and lower respiratory specimens during the acute phase of infection. The lowest concentration of SARS-CoV-2 viral copies this assay can detect is 250 copies / mL. A negative result does not preclude SARS-CoV-2 infection and should not be used as the sole basis for treatment or other patient management decisions.  A negative result may occur with improper specimen collection / handling, submission of specimen other than nasopharyngeal swab, presence of viral mutation(s) within the areas targeted by this assay, and inadequate number of viral copies (<250 copies / mL). A negative result must be combined with clinical observations, patient history, and epidemiological information.  Fact Sheet for Patients:   BoilerBrush.com.cy  Fact Sheet for Healthcare  Providers: https://pope.com/  This test is not yet approved or  cleared by the [place] FDA and has been authorized for detection and/or diagnosis of SARS-CoV-2 by FDA under an Emergency Use Authorization (EUA).  This EUA will remain in effect (meaning this test can be used) for the duration of the COVID-19 declaration under Section 564(b)(1) of the Act, 21 U.S.C. section 360bbb-3(b)(1), unless the authorization is terminated or revoked sooner.  Performed at [facility], [address] [place], [place] [address]   MRSA PCR Screening     Status: None   Collection Time: 08/28/20  5:56 PM   Specimen: Nasal Mucosa; Nasopharyngeal  Result Value Ref Range Status   MRSA by PCR NEGATIVE NEGATIVE Final    Comment:        The GeneXpert MRSA Assay (FDA approved for NASAL specimens only), is one component of a comprehensive MRSA colonization surveillance program. It is not intended to diagnose MRSA infection nor to guide or monitor treatment for MRSA infections. Performed at [facility], [address], [place], [place] [address]      Radiology Studies: No results found.  Scheduled Meds: . aspirin EC  81 mg Oral Daily  . citalopram  20 mg Oral QHS  . enoxaparin (LOVENOX) injection  40 mg Subcutaneous Q24H  . furosemide  60 mg Intravenous BID  . insulin aspart  0-9 Units Subcutaneous TID WC  . isosorbide mononitrate  30 mg Oral Daily  . lisinopril  20 mg Oral Daily  . melatonin  10 mg Oral QHS  . metoprolol tartrate  50 mg Oral BID  . pravastatin  40 mg Oral q1800  . risperiDONE  0.5 mg Oral BID  . ticagrelor  90 mg Oral BID  . traZODone  75 mg Oral QHS   Continuous Infusions: . azithromycin 500 mg (08/29/20 2039)  . cefTRIAXone (ROCEPHIN)  IV 2 g (08/29/20 1951)     LOS: 4 days    Time spent:  25 mins.    Cipriano Bunker, MD [facility]   If 7PM-7AM, please contact night-coverage

## 2020-08-30 NOTE — Progress Notes (Signed)
Progress Note  Patient Name: Jo Hays Date of Encounter: 08/30/2020  [facility] Cardiologist: Lance Muss, MD   Subjective   Ongoing SOB  Inpatient Medications    Scheduled Meds: . aspirin EC  81 mg Oral Daily  . citalopram  20 mg Oral QHS  . enoxaparin (LOVENOX) injection  40 mg Subcutaneous Q24H  . furosemide  40 mg Intravenous BID  . insulin aspart  0-9 Units Subcutaneous TID WC  . isosorbide mononitrate  30 mg Oral Daily  . melatonin  10 mg Oral QHS  . metoprolol tartrate  50 mg Oral BID  . pravastatin  40 mg Oral q1800  . risperiDONE  0.5 mg Oral BID  . ticagrelor  90 mg Oral BID  . traZODone  75 mg Oral QHS   Continuous Infusions: . azithromycin 500 mg (08/29/20 2039)  . cefTRIAXone (ROCEPHIN)  IV 2 g (08/29/20 1951)   PRN Meds: acetaminophen, ipratropium-albuterol, ondansetron **OR** ondansetron (ZOFRAN) IV   Vital Signs    Vitals:   08/29/20 1608 08/29/20 1922 08/30/20 0201 08/30/20 0616  BP: (!) 157/84 (!) 165/77 (!) 143/96 (!) 154/97  Pulse: (!) 55 66 77 71  Resp: 20 20 20 18   Temp: 98.2 F (36.8 C) 98.4 F (36.9 C) 98.4 F (36.9 C) 98.4 F (36.9 C)  TempSrc: Oral Oral Oral Oral  SpO2: 98% 96% 95% 95%  Weight:    103.7 kg  Height:        Intake/Output Summary (Last 24 hours) at 08/30/2020 0819 Last data filed at 08/29/2020 2123 Gross per 24 hour  Intake 240 ml  Output 1450 ml  Net -1210 ml   Last 3 Weights 08/30/2020 08/29/2020 08/28/2020  Weight (lbs) 228 lb 9.9 oz 226 lb 10.1 oz 231 lb 0.7 oz  Weight (kg) 103.7 kg 102.8 kg 104.8 kg      Telemetry    n/a - Personally Reviewed  ECG    - Personally Reviewed  Physical Exam   GEN: No acute distress.   Neck: No JVD Cardiac: RRR, no murmurs, rubs, or gallops.  Respiratory: Clear to auscultation bilaterally. GI: Soft, nontender, non-distended  MS: 1+ bilateral LE edema; No deformity. Neuro:  Nonfocal  Psych: Normal affect   Labs    High Sensitivity Troponin:     Recent Labs  Lab 08/26/20 1725 08/26/20 1923 08/26/20 2055 08/26/20 2300  TROPONINIHS 25* 28* 24* 29*      Chemistry Recent Labs  Lab 08/26/20 1725 08/26/20 2055 08/28/20 0447 08/29/20 0217 08/30/20 0147  NA 136   < > 139 139 139  K 3.9   < > 3.9 3.6 3.9  CL 98   < > 100 95* 96*  CO2 27   < > 29 35* 33*  GLUCOSE 217*   < > 121* 129* 121*  BUN 15   < > 15 15 15   CREATININE 1.25*   < > 1.23* 1.16* 1.06*  CALCIUM 8.6*   < > 8.2* 8.5* 8.7*  PROT 6.8  --   --   --   --   ALBUMIN 2.8*  --   --   --   --   AST 18  --   --   --   --   ALT 22  --   --   --   --   ALKPHOS 64  --   --   --   --   BILITOT 0.5  --   --   --   --  GFRNONAA 45*   < > 46* 50* 55*  GFRAA 53*   < > 54* 58* >60  ANIONGAP 11   < > 10 9 10    < > = values in this interval not displayed.     Hematology Recent Labs  Lab 08/26/20 1725 08/26/20 2055 08/27/20 0452  WBC 10.0 10.4 11.1*  RBC 4.87 4.79 4.82  HGB 13.3 12.7 13.1  HCT 42.1 41.3 42.2  MCV 86.4 86.2 87.6  MCH 27.3 26.5 27.2  MCHC 31.6 30.8 31.0  RDW 15.5 15.1 15.4  PLT 238 230 210    BNP Recent Labs  Lab 08/26/20 1734  BNP 193.1*     DDimer  Recent Labs  Lab 08/27/20 1316  DDIMER 1.67*     Radiology    No results found.  Cardiac Studies     Patient Profile     64 y.o. female with a hx of NSTEMI 04/2018 s/p DES RCA & LAD w/ PTCA D1, HTN, HLD, RBBB,Dementia,who is being seen for the evaluation ofCHF.  Assessment & Plan    1. Acute on chronic diastolic HF - 08/2020 echo: LVEF 60-65%, grade II DDx, RV not well visualized - neg 1.2 L yesterday, neg 6.3 L since admissoin. On lasix 40mg  bid, downtrend in Cr with diuresis consistent with venous congestion and CHF.  - increase IV lasix to 60mg  bid   2. History of CAD - prior DES to RCA, LAD, PTCA of D1 in 04/2018 - mild flat trop not consistent with ACS, chest pain improved with bp control and diuresis. Defer DAPT duration and dosing to her primary outpatient  cardiologist  3. HTN - remains elevated - restart her home lisinopril 20mg  daily  4. Possible pneumonia - abx per primary team     For questions or updates, please contact [facility] Please consult [email or web address] for contact info under        Signed, , MD  08/30/2020, 8:19 AM

## 2020-08-31 LAB — GLUCOSE, CAPILLARY
Glucose-Capillary: 122 mg/dL — ABNORMAL HIGH (ref 70–99)
Glucose-Capillary: 132 mg/dL — ABNORMAL HIGH (ref 70–99)
Glucose-Capillary: 124 mg/dL — ABNORMAL HIGH (ref 70–99)
Glucose-Capillary: 205 mg/dL — ABNORMAL HIGH (ref 70–99)

## 2020-08-31 LAB — BASIC METABOLIC PANEL
Anion gap: 11 (ref 5–15)
BUN: 15 mg/dL (ref 8–23)
CO2: 32 mmol/L (ref 22–32)
Calcium: 8.9 mg/dL (ref 8.9–10.3)
Chloride: 94 mmol/L — ABNORMAL LOW (ref 98–111)
Creatinine, Ser: 1.02 mg/dL — ABNORMAL HIGH (ref 0.44–1.00)
GFR calc Af Amer: >60 mL/min (ref >60)
GFR calc non Af Amer: 58 mL/min — ABNORMAL LOW (ref >60)
Glucose, Bld: 129 mg/dL — ABNORMAL HIGH (ref 70–99)
Potassium: 3.7 mmol/L (ref 3.5–5.1)
Sodium: 137 mmol/L (ref 135–145)

## 2020-08-31 LAB — PHOSPHORUS: Phosphorus: 5.1 mg/dL — ABNORMAL HIGH (ref 2.5–4.6)

## 2020-08-31 LAB — MAGNESIUM: Magnesium: 1.6 mg/dL — ABNORMAL LOW (ref 1.7–2.4)

## 2020-08-31 MED ORDER — MAGNESIUM SULFATE 2 GM/50ML IV SOLN
2.0000 g | Freq: Once | INTRAVENOUS | Status: AC
  Administered 2020-08-31: 2 g via INTRAVENOUS
  Filled 2020-08-31: qty 50

## 2020-08-31 NOTE — Progress Notes (Signed)
Progress Note  Patient Name: Jo Hays Date of Encounter: 08/31/2020  [facility] Cardiologist: Lance Muss, MD   Subjective   No complaints  Inpatient Medications    Scheduled Meds: . aspirin EC  81 mg Oral Daily  . citalopram  20 mg Oral QHS  . enoxaparin (LOVENOX) injection  40 mg Subcutaneous Q24H  . furosemide  60 mg Intravenous BID  . insulin aspart  0-9 Units Subcutaneous TID WC  . isosorbide mononitrate  30 mg Oral Daily  . lisinopril  20 mg Oral Daily  . melatonin  10 mg Oral QHS  . metoprolol tartrate  50 mg Oral BID  . pravastatin  40 mg Oral q1800  . risperiDONE  0.5 mg Oral BID  . ticagrelor  90 mg Oral BID  . traZODone  75 mg Oral QHS   Continuous Infusions: . azithromycin 500 mg (08/30/20 2107)  . cefTRIAXone (ROCEPHIN)  IV 2 g (08/30/20 2232)   PRN Meds: acetaminophen, ipratropium-albuterol, ondansetron **OR** ondansetron (ZOFRAN) IV   Vital Signs    Vitals:   08/31/20 0404 08/31/20 0431 08/31/20 0617 08/31/20 0724  BP: 113/62   130/73  Pulse: (!) 50 (!) 50 65 67  Resp: 18 18 19 20   Temp:  97.9 F (36.6 C)  97.8 F (36.6 C)  TempSrc:    Oral  SpO2: 93% 92% 98% 94%  Weight:   101.9 kg   Height:        Intake/Output Summary (Last 24 hours) at 08/31/2020 0744 Last data filed at 08/30/2020 1721 Gross per 24 hour  Intake 720 ml  Output --  Net 720 ml   Last 3 Weights 08/31/2020 08/30/2020 08/29/2020  Weight (lbs) 224 lb 9.6 oz 228 lb 9.9 oz 226 lb 10.1 oz  Weight (kg) 101.878 kg 103.7 kg 102.8 kg      Telemetry    SR, brief bradycardia while sleeping - Personally Reviewed  ECG    n/a - Personally Reviewed  Physical Exam   GEN: No acute distress.   Neck: No JVD Cardiac: RRR, no murmurs, rubs, or gallops.  Respiratory: Clear to auscultation bilaterally. GI: Soft, nontender, non-distended  MS: 1+ bilateral LE edema; No deformity. Neuro:  Nonfocal  Psych: Normal affect   Labs    High Sensitivity Troponin:   Recent  Labs  Lab 08/26/20 1725 08/26/20 1923 08/26/20 2055 08/26/20 2300  TROPONINIHS 25* 28* 24* 29*      Chemistry Recent Labs  Lab 08/26/20 1725 08/26/20 2055 08/29/20 0217 08/30/20 0147 08/31/20 0639  NA 136   < > 139 139 137  K 3.9   < > 3.6 3.9 3.7  CL 98   < > 95* 96* 94*  CO2 27   < > 35* 33* 32  GLUCOSE 217*   < > 129* 121* 129*  BUN 15   < > 15 15 15   CREATININE 1.25*   < > 1.16* 1.06* 1.02*  CALCIUM 8.6*   < > 8.5* 8.7* 8.9  PROT 6.8  --   --   --   --   ALBUMIN 2.8*  --   --   --   --   AST 18  --   --   --   --   ALT 22  --   --   --   --   ALKPHOS 64  --   --   --   --   BILITOT 0.5  --   --   --   --  GFRNONAA 45*   < > 50* 55* 58*  GFRAA 53*   < > 58* >60 >60  ANIONGAP 11   < > 9 10 11    < > = values in this interval not displayed.     Hematology Recent Labs  Lab 08/26/20 1725 08/26/20 2055 08/27/20 0452  WBC 10.0 10.4 11.1*  RBC 4.87 4.79 4.82  HGB 13.3 12.7 13.1  HCT 42.1 41.3 42.2  MCV 86.4 86.2 87.6  MCH 27.3 26.5 27.2  MCHC 31.6 30.8 31.0  RDW 15.5 15.1 15.4  PLT 238 230 210    BNP Recent Labs  Lab 08/26/20 1734  BNP 193.1*     DDimer  Recent Labs  Lab 08/27/20 1316  DDIMER 1.67*     Radiology    No results found.  Cardiac Studies     Patient Profile        64 y.o.femalewith a hx of NSTEMI 04/2018 s/p DES RCA & LAD w/ PTCA D1, HTN, HLD, RBBB,Dementia,who is being seen for the evaluation ofCHF.  Assessment & Plan    1. Acute on chronic diastolic HF - 08/2020 echo: LVEF 60-65%, grade II DDx, RV not well visualized - I/Os incomplete yesterday We increased  Lasix to 60mg  bid, downtrend in Cr with diuresis consistent with venous congestion and CHF. Weights labile and appear inaccurate  - remains volume overloaded, continue IV lasix today   2. History of CAD - prior DES to RCA, LAD, PTCA of D1 in 04/2018 - mild flat trop not consistent with ACS, chest pain improved with bp control and diuresis. Defer DAPT  duration and dosing to her primary outpatient cardiologist  3. HTN - improved since restarting her home lisinopril 20mg  yesterday  4. Possible pneumonia - abx per primary team  For questions or updates, please contact [facility] Please consult [email or web address] for contact info under        Signed, , MD  08/31/2020, 7:44 AM

## 2020-08-31 NOTE — Progress Notes (Signed)
PROGRESS NOTE    Jo Hays  TDD:[id number] DOB: 1956-08-30 DOA: 08/26/2020 PCP: Patient, No Pcp Per   Brief Narrative: 64 y.o. Female with PMH of  obesity, congenital cognitive delay, DM, HTN and CAD who presented with chest pain. In the ER, she was found to have BP 200 systolic, CXR with bilateral infiltrates, BNP elevation, and so was started on diuretics and admitted for Acute on chronic CHF exacerbation.  So far she is improving and being diuresed with lasix.  HTN has improved. Cardiology wants to continue aggressive diuresis for 24 hrs, Patient has 2.47sec pause on tele, she remains asymptomatic, EKG, sinus bradycardia, metoprolol kept on hold.   Assessment & Plan:   Principal Problem:   Unstable angina (HCC) Active Problems:   Acute respiratory failure (HCC)   CAD (coronary artery disease)   Hypertensive urgency   Acute on chronic diastolic CHF/ Hypertension with hypetensive urgency:  - Patient is diuresed for a total of 6 L so far, net -2 L until 9/17, creatinine improving. - Echo 9/21: LVEF 60-65%, grade II Diastolic dysfunction -Continue Furosemide IV.   -K supplementation. -Strict I/Os, daily weights, telemetry  -Daily monitoring renal function. -Cardiology consulted, recommended increasing lasix to 60 BID.     Possible community-acquired pneumonia. -Continue azithromycin and ceftriaxone( Day 5/5). -She is on 2 L supplemental oxygen saturating 94%.  Coronary disease, secondary prevention ACS ruled out.  Presented with chest pain which is resolved -Continue aspirin, Brilinta. -hold metoprolol given bradycardia, pauses on tele  -Continue Imdur, pravastatin  Developmental delay Depression: -Continue risperidone, citalopram.  Chronic kidney disease stage IIIa Creatinine stable,  at baseline.  Morbid obesity BMI 40.1    DVT prophylaxis:  Lovenox. Code Status: Full Family Communication: Discussed with patient. Disposition Plan:  Status is:  Inpatient  Remains inpatient appropriate because:IV treatments appropriate due to intensity of illness or inability to take PO and Inpatient level of care appropriate due to severity of illness   Dispo: The patient is from: Home              Anticipated d/c is to: Group home              Anticipated d/c date is: 1 day              Patient currently is not medically stable to d/c.  Consultants:   Cardiology.  Procedures: None. Antimicrobials:  Anti-infectives (From admission, onward)   Start     Dose/Rate Route Frequency Ordered Stop   08/27/20 2000  cefTRIAXone (ROCEPHIN) 2 g in sodium chloride 0.9 % 100 mL IVPB        2 g 200 mL/hr over 30 Minutes Intravenous Every 24 hours 08/26/20 2100 09/01/20 1959   08/27/20 2000  azithromycin (ZITHROMAX) 500 mg in sodium chloride 0.9 % 250 mL IVPB        500 mg 250 mL/hr over 60 Minutes Intravenous Every 24 hours 08/26/20 2100 09/01/20 1959   08/26/20 2015  cefTRIAXone (ROCEPHIN) 1 g in sodium chloride 0.9 % 100 mL IVPB        1 g 200 mL/hr over 30 Minutes Intravenous  Once 08/26/20 2005 08/26/20 2121   08/26/20 2015  azithromycin (ZITHROMAX) 500 mg in sodium chloride 0.9 % 250 mL IVPB        500 mg 250 mL/hr over 60 Minutes Intravenous  Once 08/26/20 2005 08/26/20 2200      Subjective: Patient was seen and examined at bedside.  Overnight events noted.  She complains of having back and hip pain.   She is on 2 L supplemental oxygen saturating 94%.  She still has 2+ pitting edema.  Objective: Vitals:   08/31/20 0617 08/31/20 0724 08/31/20 1224 08/31/20 1405  BP:  130/73 111/66 138/69  Pulse: 65 67 (!) 52 (!) 55  Resp: 19 20 19 17   Temp:  97.8 F (36.6 C) 97.6 F (36.4 C) 97.8 F (36.6 C)  TempSrc:  Oral Oral Oral  SpO2: 98% 94% 95% 94%  Weight: 101.9 kg     Height:        Intake/Output Summary (Last 24 hours) at 08/31/2020 1427 Last data filed at 08/31/2020 1224 Gross per 24 hour  Intake 480 ml  Output --  Net 480 ml    Filed Weights   08/29/20 0421 08/30/20 0616 08/31/20 0617  Weight: 102.8 kg 103.7 kg 101.9 kg    Examination:  General exam: Appears calm and comfortable. Alert, oriented x1  Respiratory system: Clear to auscultation. Respiratory effort normal. Cardiovascular system: S1 & S2 heard, RRR. No JVD, murmurs, rubs, gallops or clicks. No pedal edema. Gastrointestinal system: Abdomen is nondistended, soft and nontender. No organomegaly or masses felt.  Normal bowel sounds heard. Central nervous system: Alert and oriented. No focal neurological deficits. Extremities: Pitting edema ++, No clubbing, No cyanosis. Skin: No rashes, lesions or ulcers Psychiatry: Judgement and insight appear normal. Mood & affect appropriate.     Data Reviewed: I have personally reviewed following labs and imaging studies  CBC: Recent Labs  Lab 08/26/20 1725 08/26/20 2055 08/27/20 0452  WBC 10.0 10.4 11.1*  NEUTROABS 7.6  --   --   HGB 13.3 12.7 13.1  HCT 42.1 41.3 42.2  MCV 86.4 86.2 87.6  PLT 238 230 210   Basic Metabolic Panel: Recent Labs  Lab 08/27/20 0452 08/28/20 0447 08/29/20 0217 08/30/20 0147 08/31/20 0639  NA 138 139 139 139 137  K 4.0 3.9 3.6 3.9 3.7  CL 101 100 95* 96* 94*  CO2 27 29 35* 33* 32  GLUCOSE 136* 121* 129* 121* 129*  BUN 17 15 15 15 15   CREATININE 1.20* 1.23* 1.16* 1.06* 1.02*  CALCIUM 8.1* 8.2* 8.5* 8.7* 8.9  MG  --   --   --   --  1.6*  PHOS  --   --   --   --  5.1*   GFR: Estimated Creatinine Clearance: 63.5 mL/min (A) (by C-G formula based on SCr of 1.02 mg/dL (H)). Liver Function Tests: Recent Labs  Lab 08/26/20 1725  AST 18  ALT 22  ALKPHOS 64  BILITOT 0.5  PROT 6.8  ALBUMIN 2.8*   No results for input(s): LIPASE, AMYLASE in the last 168 hours. No results for input(s): AMMONIA in the last 168 hours. Coagulation Profile: No results for input(s): INR, PROTIME in the last 168 hours. Cardiac Enzymes: No results for input(s): CKTOTAL, CKMB,  CKMBINDEX, TROPONINI in the last 168 hours. BNP (last 3 results) No results for input(s): PROBNP in the last 8760 hours. HbA1C: No results for input(s): HGBA1C in the last 72 hours. CBG: Recent Labs  Lab 08/30/20 1329 08/30/20 1708 08/30/20 2103 08/31/20 0612 08/31/20 1226  GLUCAP 179* 84 173* 122* 132*   Lipid Profile: Recent Labs    08/29/20 0217  CHOL 143  HDL 38*  LDLCALC 83  TRIG 09/02/20  CHOLHDL 3.8   Thyroid Function Tests: No results for input(s): TSH, T4TOTAL, FREET4, T3FREE, THYROIDAB in the last 72 hours.  Anemia Panel: No results for input(s): VITAMINB12, FOLATE, FERRITIN, TIBC, IRON, RETICCTPCT in the last 72 hours. Sepsis Labs: Recent Labs  Lab 08/26/20 2150  PROCALCITON <0.10    Recent Results (from the past 240 hour(s))  SARS Coronavirus 2 by RT PCR (hospital order, performed in [facility] hospital lab) Nasopharyngeal Nasopharyngeal Swab     Status: None   Collection Time: 08/26/20  7:23 PM   Specimen: Nasopharyngeal Swab  Result Value Ref Range Status   SARS Coronavirus 2 NEGATIVE NEGATIVE Final    Comment: (NOTE) SARS-CoV-2 target nucleic acids are NOT DETECTED.  The SARS-CoV-2 RNA is generally detectable in upper and lower respiratory specimens during the acute phase of infection. The lowest concentration of SARS-CoV-2 viral copies this assay can detect is 250 copies / mL. A negative result does not preclude SARS-CoV-2 infection and should not be used as the sole basis for treatment or other patient management decisions.  A negative result may occur with improper specimen collection / handling, submission of specimen other than nasopharyngeal swab, presence of viral mutation(s) within the areas targeted by this assay, and inadequate number of viral copies (<250 copies / mL). A negative result must be combined with clinical observations, patient history, and epidemiological information.  Fact Sheet for Patients:    BoilerBrush.com.cy  Fact Sheet for Healthcare Providers: https://pope.com/  This test is not yet approved or  cleared by the [place] FDA and has been authorized for detection and/or diagnosis of SARS-CoV-2 by FDA under an Emergency Use Authorization (EUA).  This EUA will remain in effect (meaning this test can be used) for the duration of the COVID-19 declaration under Section 564(b)(1) of the Act, 21 U.S.C. section 360bbb-3(b)(1), unless the authorization is terminated or revoked sooner.  Performed at [facility], [address] [place], [place] [address]   MRSA PCR Screening     Status: None   Collection Time: 08/28/20  5:56 PM   Specimen: Nasal Mucosa; Nasopharyngeal  Result Value Ref Range Status   MRSA by PCR NEGATIVE NEGATIVE Final    Comment:        The GeneXpert MRSA Assay (FDA approved for NASAL specimens only), is one component of a comprehensive MRSA colonization surveillance program. It is not intended to diagnose MRSA infection nor to guide or monitor treatment for MRSA infections. Performed at [facility], [address], [place], [place] [address]      Radiology Studies: No results found.  Scheduled Meds: . aspirin EC  81 mg Oral Daily  . citalopram  20 mg Oral QHS  . enoxaparin (LOVENOX) injection  40 mg Subcutaneous Q24H  . furosemide  60 mg Intravenous BID  . insulin aspart  0-9 Units Subcutaneous TID WC  . isosorbide mononitrate  30 mg Oral Daily  . lisinopril  20 mg Oral Daily  . melatonin  10 mg Oral QHS  . pravastatin  40 mg Oral q1800  . risperiDONE  0.5 mg Oral BID  . ticagrelor  90 mg Oral BID  . traZODone  75 mg Oral QHS   Continuous Infusions: . azithromycin 500 mg (08/30/20 2107)  . cefTRIAXone (ROCEPHIN)  IV 2 g (08/30/20 2232)     LOS: 5 days    Time spent:  25 mins.    Cipriano Bunker, MD [facility]   If 7PM-7AM, please contact night-coverage

## 2020-08-31 NOTE — Progress Notes (Signed)
Telemetry called for a 2.47 sec pause with HR 37 at 1322.  Paged Dr. Casper Harrison, MD.  12-lead EKG obtained, VSS.

## 2020-09-01 DIAGNOSIS — I5031 Acute diastolic (congestive) heart failure: Secondary | ICD-10-CM

## 2020-09-01 LAB — GLUCOSE, CAPILLARY
Glucose-Capillary: 122 mg/dL — ABNORMAL HIGH (ref 70–99)
Glucose-Capillary: 172 mg/dL — ABNORMAL HIGH (ref 70–99)
Glucose-Capillary: 104 mg/dL — ABNORMAL HIGH (ref 70–99)
Glucose-Capillary: 175 mg/dL — ABNORMAL HIGH (ref 70–99)

## 2020-09-01 LAB — HEMOGLOBIN AND HEMATOCRIT, BLOOD
HCT: 44.3 % (ref 36.0–46.0)
Hemoglobin: 13.5 g/dL (ref 12.0–15.0)

## 2020-09-01 LAB — BASIC METABOLIC PANEL
Anion gap: 11 (ref 5–15)
BUN: 15 mg/dL (ref 8–23)
CO2: 34 mmol/L — ABNORMAL HIGH (ref 22–32)
Calcium: 8.8 mg/dL — ABNORMAL LOW (ref 8.9–10.3)
Chloride: 94 mmol/L — ABNORMAL LOW (ref 98–111)
Creatinine, Ser: 1.06 mg/dL — ABNORMAL HIGH (ref 0.44–1.00)
GFR calc Af Amer: >60 mL/min (ref >60)
GFR calc non Af Amer: 55 mL/min — ABNORMAL LOW (ref >60)
Glucose, Bld: 98 mg/dL (ref 70–99)
Potassium: 3.1 mmol/L — ABNORMAL LOW (ref 3.5–5.1)
Sodium: 139 mmol/L (ref 135–145)

## 2020-09-01 LAB — SARS CORONAVIRUS 2 BY RT PCR (HOSPITAL ORDER, PERFORMED IN ~~LOC~~ HOSPITAL LAB): SARS Coronavirus 2: NEGATIVE

## 2020-09-01 LAB — MAGNESIUM: Magnesium: 1.8 mg/dL (ref 1.7–2.4)

## 2020-09-01 LAB — PHOSPHORUS: Phosphorus: 5 mg/dL — ABNORMAL HIGH (ref 2.5–4.6)

## 2020-09-01 MED ORDER — MAGNESIUM SULFATE 2 GM/50ML IV SOLN
2.0000 g | Freq: Once | INTRAVENOUS | Status: AC
  Administered 2020-09-01: 2 g via INTRAVENOUS
  Filled 2020-09-01: qty 50

## 2020-09-01 MED ORDER — METOPROLOL TARTRATE 12.5 MG HALF TABLET
12.5000 mg | ORAL_TABLET | Freq: Two times a day (BID) | ORAL | Status: DC
  Administered 2020-09-01 – 2020-09-02 (×2): 12.5 mg via ORAL
  Filled 2020-09-01 (×3): qty 1

## 2020-09-01 MED ORDER — FUROSEMIDE 40 MG PO TABS
40.0000 mg | ORAL_TABLET | Freq: Every day | ORAL | Status: DC
  Administered 2020-09-02: 40 mg via ORAL
  Filled 2020-09-01: qty 1

## 2020-09-01 MED ORDER — POTASSIUM CHLORIDE 20 MEQ PO PACK
40.0000 meq | PACK | Freq: Once | ORAL | Status: AC
  Administered 2020-09-01: 40 meq via ORAL
  Filled 2020-09-01: qty 2

## 2020-09-01 MED ORDER — POTASSIUM CHLORIDE CRYS ER 20 MEQ PO TBCR
40.0000 meq | EXTENDED_RELEASE_TABLET | Freq: Once | ORAL | Status: AC
  Administered 2020-09-01: 40 meq via ORAL
  Filled 2020-09-01: qty 2

## 2020-09-01 NOTE — Progress Notes (Signed)
PROGRESS NOTE    Jo Hays  WUJ:[id number] DOB: Aug 28, 1956 DOA: 08/26/2020 PCP: Patient, No Pcp Per   Brief Narrative: 64 y.o. Female with PMH of  obesity, congenital cognitive delay, DM, HTN and CAD who presented with chest pain. In the ER, she was found to have BP 200 systolic, CXR with bilateral infiltrates, BNP elevation, and so was started on diuretics and admitted for Acute on chronic CHF exacerbation.  So far she is improving and being diuresed with lasix.  HTN has improved. Cardiology wants to continue iv diuresis for 24 hrs, Patient had 2.47sec pause on tele, she remains asymptomatic, EKG, sinus bradycardia, metoprolol kept on hold briefly and now resumed.    Assessment & Plan:   Principal Problem:   Unstable angina (HCC) Active Problems:   Acute respiratory failure (HCC)   CAD (coronary artery disease)   Hypertensive urgency   Acute on chronic diastolic CHF/ Hypertension with hypetensive urgency:  - Patient is diuresed for a total of 7.2 L so far, creatinine improving. - Echo 9/21: LVEF 60-65%, grade II Diastolic dysfunction -Continue Furosemide IV, switch to PO lasix tomorrow. -K supplementation. -Strict I/Os, daily weights, telemetry  -Daily monitoring renal function. -Cardiology recommended iv diuresis, discontinue aspirin and continue Brilanta.    Possible community-acquired pneumonia. -Completed  azithromycin and ceftriaxone for 5 days, -She is on 2 L supplemental oxygen saturating 94%.   Coronary disease, secondary prevention ACS ruled out.   Presented with chest pain which is resolved -Cardio recommended, DC aspirin , continue  Brilinta. -metoprolol held briefly for bradycardia, pauses on tele,  - Metoprolol resumed today , moniter HR   -Continue Imdur, pravastatin  Developmental delay Depression: -Continue risperidone, citalopram.  Chronic kidney disease stage IIIa Creatinine stable,  at baseline.  Morbid obesity BMI 40.1    DVT  prophylaxis:  Lovenox. Code Status: Full Family Communication: Discussed with patient. Disposition Plan:  Status is: Inpatient  Remains inpatient appropriate because:IV treatments appropriate due to intensity of illness or inability to take PO and Inpatient level of care appropriate due to severity of illness   Dispo: The patient is from: Home              Anticipated d/c is to: Group home              Anticipated d/c date is: 1 day              Patient currently is not medically stable to d/c.  Consultants:   Cardiology.  Procedures: None. Antimicrobials:  Anti-infectives (From admission, onward)   Start     Dose/Rate Route Frequency Ordered Stop   08/27/20 2000  cefTRIAXone (ROCEPHIN) 2 g in sodium chloride 0.9 % 100 mL IVPB        2 g 200 mL/hr over 30 Minutes Intravenous Every 24 hours 08/26/20 2100 08/31/20 2103   08/27/20 2000  azithromycin (ZITHROMAX) 500 mg in sodium chloride 0.9 % 250 mL IVPB        500 mg 250 mL/hr over 60 Minutes Intravenous Every 24 hours 08/26/20 2100 08/31/20 2159   08/26/20 2015  cefTRIAXone (ROCEPHIN) 1 g in sodium chloride 0.9 % 100 mL IVPB        1 g 200 mL/hr over 30 Minutes Intravenous  Once 08/26/20 2005 08/26/20 2121   08/26/20 2015  azithromycin (ZITHROMAX) 500 mg in sodium chloride 0.9 % 250 mL IVPB        500 mg 250 mL/hr over 60 Minutes Intravenous  Once 08/26/20 2005 08/26/20 2200      Subjective: Patient was seen and examined at bedside.  Overnight events noted.   She is on 2 L supplemental oxygen saturating 93%.  She still has 2+ pitting edema and complains of having hip and back pain.  Objective: Vitals:   09/01/20 0434 09/01/20 0435 09/01/20 0750 09/01/20 1157  BP: 130/77  (!) 154/98 133/85  Pulse: 98  79 67  Resp: 20  20 20   Temp: 98.2 F (36.8 C)  98.6 F (37 C) 98.3 F (36.8 C)  TempSrc: Oral  Oral Oral  SpO2: 93%  93% 94%  Weight:  99.9 kg    Height:        Intake/Output Summary (Last 24 hours) at 09/01/2020  1532 Last data filed at 09/01/2020 1319 Gross per 24 hour  Intake 870 ml  Output 2400 ml  Net -1530 ml   Filed Weights   08/30/20 0616 08/31/20 0617 09/01/20 0435  Weight: 103.7 kg 101.9 kg 99.9 kg    Examination:  General exam: Appears calm and comfortable. Alert, oriented x1  Respiratory system: Clear to auscultation. Respiratory effort normal. Cardiovascular system: S1 & S2 heard, RRR. No JVD, murmurs, rubs, gallops or clicks. No pedal edema. Gastrointestinal system: Abdomen is nondistended, soft and nontender. No organomegaly or masses felt.  Normal bowel sounds heard. Central nervous system: Alert and oriented. No focal neurological deficits. Extremities: Pitting edema ++, No clubbing, No cyanosis. Back: slight tenderness noted.  Skin: No rashes, lesions or ulcers Psychiatry: Judgement and insight appear normal. Mood & affect appropriate.     Data Reviewed: I have personally reviewed following labs and imaging studies  CBC: Recent Labs  Lab 08/26/20 1725 08/26/20 2055 08/27/20 0452 09/01/20 0206  WBC 10.0 10.4 11.1*  --   NEUTROABS 7.6  --   --   --   HGB 13.3 12.7 13.1 13.5  HCT 42.1 41.3 42.2 44.3  MCV 86.4 86.2 87.6  --   PLT 238 230 210  --    Basic Metabolic Panel: Recent Labs  Lab 08/28/20 0447 08/29/20 0217 08/30/20 0147 08/31/20 0639 09/01/20 0206  NA 139 139 139 137 139  K 3.9 3.6 3.9 3.7 3.1*  CL 100 95* 96* 94* 94*  CO2 29 35* 33* 32 34*  GLUCOSE 121* 129* 121* 129* 98  BUN 15 15 15 15 15   CREATININE 1.23* 1.16* 1.06* 1.02* 1.06*  CALCIUM 8.2* 8.5* 8.7* 8.9 8.8*  MG  --   --   --  1.6* 1.8  PHOS  --   --   --  5.1* 5.0*   GFR: Estimated Creatinine Clearance: 60.4 mL/min (A) (by C-G formula based on SCr of 1.06 mg/dL (H)). Liver Function Tests: Recent Labs  Lab 08/26/20 1725  AST 18  ALT 22  ALKPHOS 64  BILITOT 0.5  PROT 6.8  ALBUMIN 2.8*   No results for input(s): LIPASE, AMYLASE in the last 168 hours. No results for input(s):  AMMONIA in the last 168 hours. Coagulation Profile: No results for input(s): INR, PROTIME in the last 168 hours. Cardiac Enzymes: No results for input(s): CKTOTAL, CKMB, CKMBINDEX, TROPONINI in the last 168 hours. BNP (last 3 results) No results for input(s): PROBNP in the last 8760 hours. HbA1C: No results for input(s): HGBA1C in the last 72 hours. CBG: Recent Labs  Lab 08/31/20 1226 08/31/20 1750 08/31/20 2051 09/01/20 0616 09/01/20 1212  GLUCAP 132* 124* 205* 122* 172*   Lipid Profile: No results  for input(s): CHOL, HDL, LDLCALC, TRIG, CHOLHDL, LDLDIRECT in the last 72 hours. Thyroid Function Tests: No results for input(s): TSH, T4TOTAL, FREET4, T3FREE, THYROIDAB in the last 72 hours. Anemia Panel: No results for input(s): VITAMINB12, FOLATE, FERRITIN, TIBC, IRON, RETICCTPCT in the last 72 hours. Sepsis Labs: Recent Labs  Lab 08/26/20 2150  PROCALCITON <0.10    Recent Results (from the past 240 hour(s))  SARS Coronavirus 2 by RT PCR (hospital order, performed in [facility] hospital lab) Nasopharyngeal Nasopharyngeal Swab     Status: None   Collection Time: 08/26/20  7:23 PM   Specimen: Nasopharyngeal Swab  Result Value Ref Range Status   SARS Coronavirus 2 NEGATIVE NEGATIVE Final    Comment: (NOTE) SARS-CoV-2 target nucleic acids are NOT DETECTED.  The SARS-CoV-2 RNA is generally detectable in upper and lower respiratory specimens during the acute phase of infection. The lowest concentration of SARS-CoV-2 viral copies this assay can detect is 250 copies / mL. A negative result does not preclude SARS-CoV-2 infection and should not be used as the sole basis for treatment or other patient management decisions.  A negative result may occur with improper specimen collection / handling, submission of specimen other than nasopharyngeal swab, presence of viral mutation(s) within the areas targeted by this assay, and inadequate number of viral copies (<250 copies / mL). A  negative result must be combined with clinical observations, patient history, and epidemiological information.  Fact Sheet for Patients:   BoilerBrush.com.cy  Fact Sheet for Healthcare Providers: https://pope.com/  This test is not yet approved or  cleared by the [place] FDA and has been authorized for detection and/or diagnosis of SARS-CoV-2 by FDA under an Emergency Use Authorization (EUA).  This EUA will remain in effect (meaning this test can be used) for the duration of the COVID-19 declaration under Section 564(b)(1) of the Act, 21 U.S.C. section 360bbb-3(b)(1), unless the authorization is terminated or revoked sooner.  Performed at [facility], [address] [place], [place] [address]   MRSA PCR Screening     Status: None   Collection Time: 08/28/20  5:56 PM   Specimen: Nasal Mucosa; Nasopharyngeal  Result Value Ref Range Status   MRSA by PCR NEGATIVE NEGATIVE Final    Comment:        The GeneXpert MRSA Assay (FDA approved for NASAL specimens only), is one component of a comprehensive MRSA colonization surveillance program. It is not intended to diagnose MRSA infection nor to guide or monitor treatment for MRSA infections. Performed at [facility], [address] [place], [place] [address]   SARS Coronavirus 2 by RT PCR (hospital order, performed in [facility] hospital lab) Nasopharyngeal Nasopharyngeal Swab     Status: None   Collection Time: 09/01/20 10:39 AM   Specimen: Nasopharyngeal Swab  Result Value Ref Range Status   SARS Coronavirus 2 NEGATIVE NEGATIVE Final    Comment: (NOTE) SARS-CoV-2 target nucleic acids are NOT DETECTED.  The SARS-CoV-2 RNA is generally detectable in upper and lower respiratory specimens during the acute phase of infection. The lowest concentration of SARS-CoV-2 viral copies this assay can detect is 250 copies / mL. A negative result does not preclude SARS-CoV-2  infection and should not be used as the sole basis for treatment or other patient management decisions.  A negative result may occur with improper specimen collection / handling, submission of specimen other than nasopharyngeal swab, presence of viral mutation(s) within the areas targeted by this assay, and inadequate  number of viral copies (<250 copies / mL). A negative result must be combined with clinical observations, patient history, and epidemiological information.  Fact Sheet for Patients:   BoilerBrush.com.cy  Fact Sheet for Healthcare Providers: https://pope.com/  This test is not yet approved or  cleared by the [place] FDA and has been authorized for detection and/or diagnosis of SARS-CoV-2 by FDA under an Emergency Use Authorization (EUA).  This EUA will remain in effect (meaning this test can be used) for the duration of the COVID-19 declaration under Section 564(b)(1) of the Act, 21 U.S.C. section 360bbb-3(b)(1), unless the authorization is terminated or revoked sooner.  Performed at [facility], [address] [place], [place] [address]      Radiology Studies: No results found.  Scheduled Meds: . aspirin EC  81 mg Oral Daily  . citalopram  20 mg Oral QHS  . enoxaparin (LOVENOX) injection  40 mg Subcutaneous Q24H  . [START ON 09/02/2020] furosemide  40 mg Oral Daily  . insulin aspart  0-9 Units Subcutaneous TID WC  . isosorbide mononitrate  30 mg Oral Daily  . lisinopril  20 mg Oral Daily  . melatonin  10 mg Oral QHS  . metoprolol tartrate  12.5 mg Oral BID  . pravastatin  40 mg Oral q1800  . risperiDONE  0.5 mg Oral BID  . ticagrelor  90 mg Oral BID  . traZODone  75 mg Oral QHS   Continuous Infusions:    LOS: 6 days    Time spent:  25 mins.    Cipriano Bunker, MD [facility]   If 7PM-7AM, please contact night-coverage

## 2020-09-01 NOTE — Progress Notes (Addendum)
Progress Note  Patient Name: Jo Hays Date of Encounter: 09/01/2020  Primary Cardiologist: Lance Muss, MD (follows with [facility])   Subjective   Complains of lower back pain and heel pain. Denies chest pain or SOB  Inpatient Medications    Scheduled Meds: . aspirin EC  81 mg Oral Daily  . citalopram  20 mg Oral QHS  . enoxaparin (LOVENOX) injection  40 mg Subcutaneous Q24H  . furosemide  60 mg Intravenous BID  . insulin aspart  0-9 Units Subcutaneous TID WC  . isosorbide mononitrate  30 mg Oral Daily  . lisinopril  20 mg Oral Daily  . melatonin  10 mg Oral QHS  . pravastatin  40 mg Oral q1800  . risperiDONE  0.5 mg Oral BID  . ticagrelor  90 mg Oral BID  . traZODone  75 mg Oral QHS   Continuous Infusions:  PRN Meds: acetaminophen, ipratropium-albuterol, ondansetron **OR** ondansetron (ZOFRAN) IV   Vital Signs    Vitals:   08/31/20 2348 09/01/20 0434 09/01/20 0435 09/01/20 0750  BP: (!) 163/87 130/77  (!) 154/98  Pulse: 64 98  79  Resp: 16 20  20   Temp: 98.3 F (36.8 C) 98.2 F (36.8 C)  98.6 F (37 C)  TempSrc: Oral Oral  Oral  SpO2: 96% 93%  93%  Weight:   99.9 kg   Height:        Intake/Output Summary (Last 24 hours) at 09/01/2020 0848 Last data filed at 09/01/2020 0420 Gross per 24 hour  Intake 390 ml  Output 2000 ml  Net -1610 ml   Filed Weights   08/30/20 0616 08/31/20 0617 09/01/20 0435  Weight: 103.7 kg 101.9 kg 99.9 kg    Physical Exam   General: Well developed, well nourished, NAD Neck: Negative for carotid bruits. No JVD Lungs: Diminished in bilateral lower lobes. No wheezes, rales, or rhonchi. Breathing is unlabored. Cardiovascular: RRR with S1 S2. No murmurs Abdomen: Soft, non-tender, non-distended. No obvious abdominal masses. MExtremities: No edema. No clubbing or cyanosis. DP/PT pulses 2+ bilaterally Neuro: Alert and oriented. No focal deficits. No facial asymmetry. MAE spontaneously. Psych: Responds to questions  appropriately with normal affect.    Labs    Chemistry Recent Labs  Lab 08/26/20 1725 08/26/20 2055 08/30/20 0147 08/31/20 0639 09/01/20 0206  NA 136   < > 139 137 139  K 3.9   < > 3.9 3.7 3.1*  CL 98   < > 96* 94* 94*  CO2 27   < > 33* 32 34*  GLUCOSE 217*   < > 121* 129* 98  BUN 15   < > 15 15 15   CREATININE 1.25*   < > 1.06* 1.02* 1.06*  CALCIUM 8.6*   < > 8.7* 8.9 8.8*  PROT 6.8  --   --   --   --   ALBUMIN 2.8*  --   --   --   --   AST 18  --   --   --   --   ALT 22  --   --   --   --   ALKPHOS 64  --   --   --   --   BILITOT 0.5  --   --   --   --   GFRNONAA 45*   < > 55* 58* 55*  GFRAA 53*   < > >60 >60 >60  ANIONGAP 11   < > 10 11 11    < > =  values in this interval not displayed.     Hematology Recent Labs  Lab 08/26/20 1725 08/26/20 1725 08/26/20 2055 08/27/20 0452 09/01/20 0206  WBC 10.0  --  10.4 11.1*  --   RBC 4.87  --  4.79 4.82  --   HGB 13.3   < > 12.7 13.1 13.5  HCT 42.1   < > 41.3 42.2 44.3  MCV 86.4  --  86.2 87.6  --   MCH 27.3  --  26.5 27.2  --   MCHC 31.6  --  30.8 31.0  --   RDW 15.5  --  15.1 15.4  --   PLT 238  --  230 210  --    < > = values in this interval not displayed.    Cardiac EnzymesNo results for input(s): TROPONINI in the last 168 hours. No results for input(s): TROPIPOC in the last 168 hours.   BNP Recent Labs  Lab 08/26/20 1734  BNP 193.1*     DDimer  Recent Labs  Lab 08/27/20 1316  DDIMER 1.67*     Radiology    No results found.  Telemetry    09/01/20 NSR - Personally Reviewed  ECG    No new tracing as of 09/01/20- Personally Reviewed  Cardiac Studies   Echocardiogram 08/27/20:  1. Technically difficult echo with poor image quality. the valves were  not visualized well.  2. Left ventricular ejection fraction, by estimation, is 60 to 65%. The  left ventricle has normal function. The left ventricle has no regional  wall motion abnormalities. Left ventricular diastolic parameters are    consistent with Grade II diastolic  dysfunction (pseudonormalization).  3. Right ventricular systolic function was not well visualized. The right  ventricular size is not well visualized.  4. Right atrial size was mildly dilated.  5. The mitral valve was not well visualized. No evidence of mitral valve  regurgitation.  6. The aortic valve was not well visualized. Aortic valve regurgitation  is not visualized.   Patient Profile     64 y.o. female with a hx of NSTEMI 04/2018 s/p DES RCA & LAD w/ PTCA D1, HTN, HLD, RBBB,Dementia,who is being seen for the evaluation ofCHF.  Assessment & Plan    1. Acute on chronic diastolic HF: -Echocardiogram 08/2020 with LVEF 60-65% and G2DD -Weight, 220lb with an admission weight at 244lb  -I&O, net negative 7.2L -Continue IV Lasix 60mg  BID>>>consider transition to PO dosing tomorrow  -Weight, net negative 7.2L -Weight, 220lb today with admission weight at 244lb  -Creatinine, 1.06 -Appears close to euvolemia on exam today   2. History of CAD: -Prior DES to RCA, LAD, PTCA of D1 in 04/2018 -hsT on presentation mildly elevated with flat trend not consistent with ACS -No recurrent chest pain symptoms   chest pain improved with bp control and diuresis. -Continue DAPT>>consider transition to Plavix at discharge>>may need to defer to primary cardiologist at [facility]?   3. HTN: -Stable, 154/98>130/77>163/87 -Continue PTA lisinopril 20mg  yesterday  4. Possible pneumonia: -Abx per primary team   5. CKD stage III: -Creatinine, 1.06 -Appears to be at baseline    Signed, [facility] NP-C HeartCare Pager: [phone] 09/01/2020, 8:48 AM     For questions or updates, please contact   Please consult [email or web address] for contact info under Cardiology/STEMI.  Patient seen, examined. Available data reviewed. Agree with findings, assessment, and plan as outlined by [phone], NP.  The patient is independently interviewed and examined.  She is  a pleasant, obese woman in no distress.  JVP is normal, lung fields have diminished breath sounds in the bases but are otherwise clear, heart is regular rate and rhythm with no murmur gallop, abdomen is soft and nontender, extremities have no edema.  She appears to be clinically improving with resolution of her edema and improvement of her volume status.  Agree with plans to convert her to oral diuretics tomorrow.  She seems to be approaching stability for discharge home.  With respect to her coronary artery disease, we reviewed her dual antiplatelet program and recommend discontinuing aspirin.  She will remain on ticagrelor as she appears to be tolerating this well.  She can follow-up with her primary cardiologist in the [facility] system to make decisions on long-term treatment.  She does not have any active signs or symptoms of myocardial ischemia.  Tonny Bollman, M.D. 09/01/2020 1:34 PM

## 2020-09-01 NOTE — TOC Initial Note (Signed)
Transition of Care [facility]) - Initial/Assessment Note    Patient Details  Name: Jo Hays MRN: [id number] Date of Birth: [birth year]  Transition of [facility]) CM/SW Contact:    Vinie Sill, [place] Phone Number: 09/01/2020, 1:46 PM  Clinical Narrative:                  CSW met with patient at bedside. CSW introduced self and ex[plained role. Patient confirmed she was from [facility] and will return when discharged.  CSW contacted [facility] # [phone]- They informed CSW, they can accept patient back tomorrow. They requested FL2, discharge summary, covid test and PT/OT eval to determine if [facility] will be needed and if so to fax orders and other requested information to # [phone].   If patient is medically stable they can admit tomorrow  CSW updated RN & MD updated  Thurmond Butts, MSW, [place] Worker   Expected Discharge Plan: Group Home Barriers to Discharge:  (group home unable to admit today, can admit tomorrow, PT/OT evals)   Patient Goals and CMS Choice        Expected Discharge Plan and Services Expected Discharge Plan: Group Home In-house Referral: Clinical Social Work     Living arrangements for the past 2 months: Group Home                                      Prior Living Arrangements/Services Living arrangements for the past 2 months: [place] Lives with:: Facility Resident Patient language and need for interpreter reviewed:: No Do you feel safe going back to the place where you live?: No      Need for Family Participation in Patient Care: Yes (Comment) Care giver support system in place?: Yes (comment)   Criminal Activity/Legal Involvement Pertinent to Current Situation/Hospitalization: No - Comment as needed  Activities of Daily Living Home Assistive Devices/Equipment: Walker (specify type) ADL Screening (condition at time of admission) Patient's cognitive ability adequate to  safely complete daily activities?: Yes Is the patient deaf or have difficulty hearing?: No Does the patient have difficulty seeing, even when wearing glasses/contacts?: No Does the patient have difficulty concentrating, remembering, or making decisions?: No Patient able to express need for assistance with ADLs?: Yes Does the patient have difficulty dressing or bathing?: No Independently performs ADLs?: Yes (appropriate for developmental age) Does the patient have difficulty walking or climbing stairs?: No Weakness of Legs: None Weakness of Arms/Hands: None  Permission Sought/Granted Permission sought to share information with : Family Supports    Share Information with NAME: Ko Vaya granted to share info w Contact Information: [phone]  Emotional Assessment Appearance:: Appears stated age Attitude/Demeanor/Rapport: Engaged Affect (typically observed): Accepting, Pleasant, Appropriate Orientation: : Oriented to Self, Oriented to Place, Oriented to  Time, Oriented to Situation Alcohol / Substance Use: Not Applicable Psych Involvement: No (comment)  Admission diagnosis:  Acute respiratory failure (HCC) [J96.00] Hypertensive crisis [I16.9] Acute pulmonary edema (HCC) [J81.0] Patient Active Problem List   Diagnosis Date Noted   Unstable angina ([place]) 08/27/2020   Acute respiratory failure ([place]) 08/26/2020   CAD (coronary artery disease) 08/26/2020   Hypertensive urgency 08/26/2020   PCP:  Patient, No Pcp Per Pharmacy:  No Pharmacies Listed    Social Determinants of Health (SDOH) Interventions    Readmission Risk Interventions No  flowsheet data found.

## 2020-09-02 LAB — GLUCOSE, CAPILLARY
Glucose-Capillary: 143 mg/dL — ABNORMAL HIGH (ref 70–99)
Glucose-Capillary: 155 mg/dL — ABNORMAL HIGH (ref 70–99)
Glucose-Capillary: 104 mg/dL — ABNORMAL HIGH (ref 70–99)
Glucose-Capillary: 120 mg/dL — ABNORMAL HIGH (ref 70–99)

## 2020-09-02 LAB — PHOSPHORUS: Phosphorus: 4.4 mg/dL (ref 2.5–4.6)

## 2020-09-02 LAB — BASIC METABOLIC PANEL
Anion gap: 10 (ref 5–15)
BUN: 12 mg/dL (ref 8–23)
CO2: 31 mmol/L (ref 22–32)
Calcium: 8.7 mg/dL — ABNORMAL LOW (ref 8.9–10.3)
Chloride: 95 mmol/L — ABNORMAL LOW (ref 98–111)
Creatinine, Ser: 0.97 mg/dL (ref 0.44–1.00)
GFR calc Af Amer: >60 mL/min (ref >60)
GFR calc non Af Amer: >60 mL/min (ref >60)
Glucose, Bld: 130 mg/dL — ABNORMAL HIGH (ref 70–99)
Potassium: 3.6 mmol/L (ref 3.5–5.1)
Sodium: 136 mmol/L (ref 135–145)

## 2020-09-02 LAB — MAGNESIUM: Magnesium: 1.8 mg/dL (ref 1.7–2.4)

## 2020-09-02 MED ORDER — FUROSEMIDE 10 MG/ML IJ SOLN
40.0000 mg | Freq: Once | INTRAMUSCULAR | Status: AC
  Administered 2020-09-02: 40 mg via INTRAVENOUS
  Filled 2020-09-02: qty 4

## 2020-09-02 MED ORDER — METOPROLOL TARTRATE 12.5 MG HALF TABLET: 12.5000 mg | ORAL_TABLET | Freq: Two times a day (BID) | ORAL | Status: DC

## 2020-09-02 MED ORDER — HYDRALAZINE HCL 20 MG/ML IJ SOLN
5.0000 mg | Freq: Once | INTRAMUSCULAR | Status: DC
  Filled 2020-09-02: qty 1

## 2020-09-02 MED ORDER — METOPROLOL TARTRATE 25 MG PO TABS
25.0000 mg | ORAL_TABLET | Freq: Two times a day (BID) | ORAL | Status: DC
  Administered 2020-09-02 – 2020-09-03 (×3): 25 mg via ORAL
  Filled 2020-09-02 (×3): qty 1

## 2020-09-02 MED ORDER — METOPROLOL TARTRATE 25 MG PO TABS: 25.0000 mg | ORAL_TABLET | Freq: Two times a day (BID) | ORAL | Status: DC

## 2020-09-02 MED ORDER — HYDRALAZINE HCL 20 MG/ML IJ SOLN: 5.0000 mg | Freq: Four times a day (QID) | INTRAMUSCULAR | Status: DC | PRN

## 2020-09-02 MED ORDER — MAGNESIUM SULFATE 2 GM/50ML IV SOLN
2.0000 g | Freq: Once | INTRAVENOUS | Status: AC
  Administered 2020-09-02: 2 g via INTRAVENOUS
  Filled 2020-09-02: qty 50

## 2020-09-02 MED ORDER — POTASSIUM CHLORIDE CRYS ER 20 MEQ PO TBCR
60.0000 meq | EXTENDED_RELEASE_TABLET | Freq: Once | ORAL | Status: AC
  Administered 2020-09-02: 60 meq via ORAL
  Filled 2020-09-02: qty 3

## 2020-09-02 NOTE — Progress Notes (Addendum)
Progress Note  Patient Name: Jo Hays Date of Encounter: 09/02/2020  [facility] Cardiologist: Lance Muss, MD   Subjective   C/o heel pain.   Inpatient Medications    Scheduled Meds: . aspirin EC  81 mg Oral Daily  . citalopram  20 mg Oral QHS  . enoxaparin (LOVENOX) injection  40 mg Subcutaneous Q24H  . furosemide  40 mg Oral Daily  . insulin aspart  0-9 Units Subcutaneous TID WC  . isosorbide mononitrate  30 mg Oral Daily  . lisinopril  20 mg Oral Daily  . melatonin  10 mg Oral QHS  . metoprolol tartrate  12.5 mg Oral BID  . pravastatin  40 mg Oral q1800  . risperiDONE  0.5 mg Oral BID  . ticagrelor  90 mg Oral BID  . traZODone  75 mg Oral QHS   Continuous Infusions:  PRN Meds: acetaminophen, ipratropium-albuterol, ondansetron **OR** ondansetron (ZOFRAN) IV   Vital Signs    Vitals:   09/02/20 0012 09/02/20 0400 09/02/20 0422 09/02/20 0816  BP: (!) 166/80  (!) 162/89 (!) 184/82  Pulse: 67  84 69  Resp: 14  15 15   Temp: 98.8 F (37.1 C)  98.6 F (37 C) 98.6 F (37 C)  TempSrc: Oral  Oral Oral  SpO2: 94% 92% 94% 93%  Weight:   100.8 kg   Height:        Intake/Output Summary (Last 24 hours) at 09/02/2020 0907 Last data filed at 09/02/2020 0424 Gross per 24 hour  Intake 1140 ml  Output 2100 ml  Net -960 ml   Last 3 Weights 09/02/2020 09/01/2020 08/31/2020  Weight (lbs) 222 lb 3.6 oz 220 lb 4.8 oz 224 lb 9.6 oz  Weight (kg) 100.8 kg 99.927 kg 101.878 kg      Telemetry    SR with PVCs - Personally Reviewed  ECG    No new tracing this morning  Physical Exam  WF, laying in bed GEN: No acute distress.   Neck: No JVD Cardiac: RRR, no murmurs, rubs, or gallops.  Respiratory: Diminished in bases with crackles, expiratory wheezing bilaterally GI: Soft, nontender, non-distended  MS: Trace bilateral LE edema bilaterally; No deformity. Neuro:  Nonfocal  Psych: Normal affect   Labs    High Sensitivity Troponin:   Recent Labs  Lab  08/26/20 1725 08/26/20 1923 08/26/20 2055 08/26/20 2300  TROPONINIHS 25* 28* 24* 29*      Chemistry Recent Labs  Lab 08/26/20 1725 08/26/20 2055 08/31/20 0639 09/01/20 0206 09/02/20 0640  NA 136   < > 137 139 136  K 3.9   < > 3.7 3.1* 3.6  CL 98   < > 94* 94* 95*  CO2 27   < > 32 34* 31  GLUCOSE 217*   < > 129* 98 130*  BUN 15   < > 15 15 12   CREATININE 1.25*   < > 1.02* 1.06* 0.97  CALCIUM 8.6*   < > 8.9 8.8* 8.7*  PROT 6.8  --   --   --   --   ALBUMIN 2.8*  --   --   --   --   AST 18  --   --   --   --   ALT 22  --   --   --   --   ALKPHOS 64  --   --   --   --   BILITOT 0.5  --   --   --   --  GFRNONAA 45*   < > 58* 55* >60  GFRAA 53*   < > >60 >60 >60  ANIONGAP 11   < > 11 11 10    < > = values in this interval not displayed.     Hematology Recent Labs  Lab 08/26/20 1725 08/26/20 1725 08/26/20 2055 08/27/20 0452 09/01/20 0206  WBC 10.0  --  10.4 11.1*  --   RBC 4.87  --  4.79 4.82  --   HGB 13.3   < > 12.7 13.1 13.5  HCT 42.1   < > 41.3 42.2 44.3  MCV 86.4  --  86.2 87.6  --   MCH 27.3  --  26.5 27.2  --   MCHC 31.6  --  30.8 31.0  --   RDW 15.5  --  15.1 15.4  --   PLT 238  --  230 210  --    < > = values in this interval not displayed.    BNP Recent Labs  Lab 08/26/20 1734  BNP 193.1*     DDimer  Recent Labs  Lab 08/27/20 1316  DDIMER 1.67*     Radiology    No results found.  Cardiac Studies   Echocardiogram 08/27/20:  1. Technically difficult echo with poor image quality. the valves were  not visualized well.  2. Left ventricular ejection fraction, by estimation, is 60 to 65%. The  left ventricle has normal function. The left ventricle has no regional  wall motion abnormalities. Left ventricular diastolic parameters are  consistent with Grade II diastolic  dysfunction (pseudonormalization).  3. Right ventricular systolic function was not well visualized. The right  ventricular size is not well visualized.  4. Right  atrial size was mildly dilated.  5. The mitral valve was not well visualized. No evidence of mitral valve  regurgitation.  6. The aortic valve was not well visualized. Aortic valve regurgitation  is not visualized.   Patient Profile     64 y.o. female  with a hx of NSTEMI 04/2018 s/p DES RCA & LAD w/ PTCA D1, HTN, HLD, RBBB,Dementia,who is being seen for the evaluation ofCHF.  Assessment & Plan    1. Acute on chronic diastolic HF: -Echocardiogram 08/2020 with LVEF 60-65% and G2DD -Admission weight at 244lb, was down to 220 yesterday, up 2 lbs overnight.  -I&O, net negative 7.9L -was switched to oral lasix today, but still with mild crackles in lung bases and LE edema. Will give 40mg  IV x1 now.  -suspect she may need 40mg  BID at discharge  2. History of CAD: -Prior DES to RCA, LAD, PTCA of D1 in 04/2018 -hsT on presentation mildly elevated with flat trend not consistent with ACS -No recurrent chest pain symptoms   chest pain improved with bp control and diuresis. -Was on DAPT prior to admission. Per Dr. would plan for Brilinta as monotherapy at discharge.   3. HTN: -elevated today. Given additional dose of lasix this morning. Will increase metoprolol 25mg  BID today -Continue PTA lisinopril 20mg    4. Possible pneumonia: -Abx per primary team   5. CKD stage III: -Creatinine, 0.97 -Appears to be at baseline   For questions or updates, please contact [facility] Please consult [email or web address] for contact info under        Signed, , NP  09/02/2020, 9:07 AM    I have personally seen and examined this patient. I agree with the assessment and plan as outlined above. She is only c/o heel  pain this am. Telemetry reviewed by me with sinus bradycardia, rate currently 60 bpm (reported into the 50s this am). She is negative 7.96 liters since admission. Exam with mild basilar crackles. Trace bilateral LE edema. RRR. NAD Will plan to continue IV Lasix  today. She seems to be nearing a euvolemic state.  Can stop ASA and plan to use Brilinta alone.  Her heart rate is in the 50s and 60s on metoprol with no long pauses noted. OK to continue current dose of metoprolol for now.    Verne Carrow 09/02/2020 11:02 AM

## 2020-09-02 NOTE — Care Management Important Message (Signed)
Important Message  Patient Details  Name: Jo Hays MRN: [id number] Date of Birth: May 10, 1956   Medicare Important Message Given:  Yes     Renie Ora 09/02/2020, 12:13 PM

## 2020-09-02 NOTE — Evaluation (Signed)
Occupational Therapy Evaluation Patient Details Name: Jo Hays MRN: [id number] DOB: 25-Jul-1956 Today's Date: 09/02/2020    History of Present Illness 64 y.o. female  with a hx of NSTEMI 04/2018 s/p DES RCA & LAD w/ PTCA D1, HTN, HLD, RBBB, Dementia, who is being seen for the evaluation of CHF.   Clinical Impression   Pleasant individual admitted for the above diagnosis.  Patient appears to be at or near her baseline for self care and basic mobility, baseline cognitive dysfunction.  Recommend home to group home when cleared by MD.  No HH OT needed.  Session cleared by nursing, BP was within MD ranges 129/67.  Patient is on 2L O2 via [place].  She was taken off O2 and de-sat to 84% on RA.  Attempted pursed lip breathing and rebounded to 88%.  O2 placed back and she rebounded to 95%.  Nurse notified.      Follow Up Recommendations  No OT follow up    Equipment Recommendations    none   Recommendations for Other Services  PT eval pending     Precautions / Restrictions Precautions Precautions: Fall Restrictions Weight Bearing Restrictions: No Other Position/Activity Restrictions: monitor BP      Mobility Bed Mobility Overal bed mobility: Modified Independent             General bed mobility comments: grabs for SR's  Transfers Overall transfer level: Independent                    Balance Overall balance assessment: Modified Independent (Reaches for objects in her environment.)                                         ADL either performed or assessed with clinical judgement   ADL Overall ADL's : At baseline                                             Vision Baseline Vision/History: No visual deficits       Perception     Praxis      Pertinent Vitals/Pain Pain Assessment: No/denies pain     Hand Dominance Right   Extremity/Trunk Assessment Upper Extremity Assessment Upper Extremity Assessment: Overall WFL for  tasks assessed   Lower Extremity Assessment Lower Extremity Assessment: Defer to PT evaluation   Cervical / Trunk Assessment Cervical / Trunk Assessment: Normal   Communication Communication Communication: No difficulties   Cognition Arousal/Alertness: Awake/alert Behavior During Therapy: WFL for tasks assessed/performed Overall Cognitive Status: History of cognitive impairments - at baseline                                                      Home Living Family/patient expects to be discharged to:: Group home                                        Prior Functioning/Environment Level of Independence: Needs assistance  Gait / Transfers Assistance Needed: largely independent - did not use an  AD ADL's / Homemaking Assistance Needed: Independent with ADL's.  Does not assist with cooking or cleaning.  Meds are managed by group home. Communication / Swallowing Assistance Needed: Independent          OT Problem List: Decreased safety awareness      OT Treatment/Interventions:      OT Goals(Current goals can be found in the care plan section) Acute Rehab OT Goals Patient Stated Goal: Hoping to go back to her group home OT Goal Formulation: With patient Time For Goal Achievement: 09/05/20 Potential to Achieve Goals: Good  OT Frequency:     Barriers to D/C:  none noted          Co-evaluation              AM-PAC OT "6 Clicks" Daily Activity     Outcome Measure Help from another person eating meals?: None Help from another person taking care of personal grooming?: None Help from another person toileting, which includes using toliet, bedpan, or urinal?: None Help from another person bathing (including washing, rinsing, drying)?: None Help from another person to put on and taking off regular upper body clothing?: None Help from another person to put on and taking off regular lower body clothing?: None 6 Click Score: 24   End  of Session Nurse Communication: Mobility status  Activity Tolerance: Patient tolerated treatment well Patient left: in chair;with call bell/phone within reach;with chair alarm set  OT Visit Diagnosis: Unsteadiness on feet (R26.81)                Time: 3474-2595 OT Time Calculation (min): 28 min Charges:  OT General Charges $OT Visit: 1 Visit OT Evaluation $OT Eval Moderate Complexity: 1 Mod OT Treatments $Self Care/Home Management : 8-22 mins  09/02/2020  [place], OTR/L  Acute Rehabilitation Services  Office:  [phone]   Suzanna Obey 09/02/2020, 10:37 AM

## 2020-09-02 NOTE — Progress Notes (Signed)
SATURATION QUALIFICATIONS: (This note is used to comply with regulatory documentation for home oxygen)  Patient Saturations on Room Air at Rest = 91%  Patient Saturations on Room Air while Ambulating = 77%, 87% second trial with constant coaching  Patient Saturations on 2 Liters of oxygen while Ambulating = 95%  Please briefly explain why patient needs home oxygen:   This pt just maintains adequate SpO2 at rest, but is dyspneic with rapid breathing during gait with significant drops in SpO2 without some supplemental oxygen.  I expect she is not far off from being able to manage without oxygen, but not presently.  09/02/2020  Jacinto Halim., PT Acute Rehabilitation Services [phone]  (pager) [phone]  (office)

## 2020-09-02 NOTE — [place] FL2 (Signed)
[place] MEDICAID FL2 LEVEL OF CARE SCREENING TOOL     IDENTIFICATION  Patient Name: Jo Hays Birthdate: [birth year] Sex: female Admission Date (Current Location): 08/26/2020  [facility] and [place] Number:  Producer, television/film/video and Address:  [facility] [place], [address], [place], [place] [address]      Provider Number: [id number]  Attending Physician Name and Address:  Cipriano Bunker, MD  Relative Name and Phone Number:  Berdine Addison  DSS Tyson Dense [phone]    Current Level of Care: Hospital Recommended Level of Care: Assisted Living Facility (Group Home) Prior Approval Number:    Date Approved/Denied:   PASRR Number:    Discharge Plan: Other (Comment) [facility])    Current Diagnoses: Patient Active Problem List   Diagnosis Date Noted   Unstable angina (HCC) 08/27/2020   Acute respiratory failure (HCC) 08/26/2020   CAD (coronary artery disease) 08/26/2020   Hypertensive urgency 08/26/2020    Orientation RESPIRATION BLADDER Height & Weight     Self, Time, Situation, Place  O2 Continent Weight: 222 lb 3.6 oz (100.8 kg) Height:  5\' 3"  (160 cm)  BEHAVIORAL SYMPTOMS/MOOD NEUROLOGICAL BOWEL NUTRITION STATUS      Continent Diet (please see discharge summary)  AMBULATORY STATUS COMMUNICATION OF NEEDS Skin     Verbally Normal                       Personal Care Assistance Level of Assistance  Bathing, Feeding, Dressing Bathing Assistance: Limited assistance Feeding assistance: Independent Dressing Assistance: Limited assistance     Functional Limitations Info  Sight, Speech, Hearing Sight Info: Adequate Hearing Info: Adequate Speech Info: Adequate    SPECIAL CARE FACTORS FREQUENCY                       Contractures Contractures Info: Not present    Additional Factors Info  Code Status, Allergies Code Status Info: FULL Allergies Info: NKA           Current Medications (09/02/2020):   This is the current hospital active medication list Current Facility-Administered Medications  Medication Dose Route Frequency Provider Last Rate Last Admin   acetaminophen (TYLENOL) tablet 650 mg  650 mg Oral Q6H PRN 09/04/2020, MD   650 mg at 08/30/20 2027   aspirin EC tablet 81 mg  81 mg Oral Daily 2028, MD   81 mg at 09/02/20 0815   citalopram (CELEXA) tablet 20 mg  20 mg Oral QHS 09/04/20, MD   20 mg at 09/01/20 2153   enoxaparin (LOVENOX) injection 40 mg  40 mg Subcutaneous Q24H 2154, MD   40 mg at 09/01/20 2154   furosemide (LASIX) injection 40 mg  40 mg Intravenous Once 2155, MD       hydrALAZINE (APRESOLINE) injection 5 mg  5 mg Intravenous Once Cipriano Bunker, MD       hydrALAZINE (APRESOLINE) injection 5 mg  5 mg Intravenous Q6H PRN Cipriano Bunker B, NP       insulin aspart (novoLOG) injection 0-9 Units  0-9 Units Subcutaneous TID WC Laverda Page, MD   2 Units at 09/02/20 1129   ipratropium-albuterol (DUONEB) 0.5-2.5 (3) MG/3ML nebulizer solution 3 mL  3 mL Nebulization Q6H PRN Danford, 09/04/20, MD       isosorbide mononitrate (IMDUR) 24 hr tablet 30 mg  30 mg Oral Daily Earl Lites, MD  30 mg at 09/02/20 0814   lisinopril (ZESTRIL) tablet 20 mg  20 mg Oral Daily Antoine Poche, MD   20 mg at 09/02/20 2683   melatonin tablet 10 mg  10 mg Oral QHS Eduard Clos, MD   10 mg at 09/01/20 2153   metoprolol tartrate (LOPRESSOR) tablet 25 mg  25 mg Oral BID Cipriano Bunker, MD       ondansetron [facility]) tablet 4 mg  4 mg Oral Q6H PRN Eduard Clos, MD       Or   ondansetron [facility]) injection 4 mg  4 mg Intravenous Q6H PRN Eduard Clos, MD   4 mg at 08/26/20 2129   pravastatin (PRAVACHOL) tablet 40 mg  40 mg Oral q1800 Eduard Clos, MD   40 mg at 09/02/20 1641   risperiDONE (RISPERDAL) tablet 0.5 mg  0.5 mg Oral BID Eduard Clos, MD   0.5 mg at  09/02/20 0815   ticagrelor (BRILINTA) tablet 90 mg  90 mg Oral BID Eduard Clos, MD   90 mg at 09/02/20 0814   traZODone (DESYREL) tablet 75 mg  75 mg Oral QHS Eduard Clos, MD   75 mg at 09/01/20 2152     Discharge Medications: Please see discharge summary for a list of discharge medications.  Relevant Imaging Results:  Relevant Lab Results:   Additional Information SSN [id number]  Eduard Roux, LCSWA

## 2020-09-02 NOTE — Progress Notes (Signed)
PROGRESS NOTE    Jo Hays  HBZ:[id number] DOB: Nov 25, 1956 DOA: 08/26/2020 PCP: Patient, No Pcp Per   Brief Narrative: 64 y.o. Female with PMH of  obesity, congenital cognitive delay, DM, HTN and CAD who presented with chest pain. In the ER, she was found to have BP 200 systolic, CXR with bilateral infiltrates, BNP elevation, and so was started on diuretics and admitted for Acute on chronic CHF exacerbation.  So far she is improving and being diuresed with lasix.  HTN has improved. Cardiology wants to continue iv diuresis for 24 hrs, Patient had 2.47sec pause on tele, she remains asymptomatic, EKG, sinus bradycardia, metoprolol kept on hold briefly and now resumed.    Assessment & Plan:   Principal Problem:   Unstable angina (HCC) Active Problems:   Acute respiratory failure (HCC)   CAD (coronary artery disease)   Hypertensive urgency   Acute on chronic diastolic CHF/ Hypertension with hypetensive urgency:  - Patient is diuresed for a total of 7.9 L so far, creatinine improving. - Echo 9/21: LVEF 60-65%, grade II Diastolic dysfunction. -Continue Furosemide IV, switch to PO lasix tomorrow. -K supplementation. -Strict I/Os, daily weights, telemetry  -Daily monitoring renal function. -Cardiology recommended iv diuresis, discontinue aspirin and continue Brilanta.    Possible community-acquired pneumonia. -Completed  azithromycin and ceftriaxone for 5 days, - She is on 2 L supplemental oxygen saturating 94%.  - wean O2 as tolerated.  Coronary disease, secondary prevention ACS ruled out.   Presented with chest pain which is resolved -Cardio recommended, DC aspirin , continue  Brilinta. -metoprolol held briefly for bradycardia, pauses on tele,  - Metoprolol resumed 9/20 , moniter HR   -Continue Imdur, pravastatin  Developmental delay Depression: -Continue risperidone, citalopram.  Chronic kidney disease stage IIIa Creatinine stable,  at baseline.  Morbid  obesity BMI 40.1    DVT prophylaxis:  Lovenox. Code Status: Full Family Communication: Discussed with patient. Disposition Plan:  Status is: Inpatient  Remains inpatient appropriate because:IV treatments appropriate due to intensity of illness or inability to take PO and Inpatient level of care appropriate due to severity of illness   Dispo: The patient is from: Home              Anticipated d/c is to: Group home              Anticipated d/c date is: 1 day              Patient currently is not medically stable to d/c.  Consultants:   Cardiology.  Procedures: None. Antimicrobials:  Anti-infectives (From admission, onward)   Start     Dose/Rate Route Frequency Ordered Stop   08/27/20 2000  cefTRIAXone (ROCEPHIN) 2 g in sodium chloride 0.9 % 100 mL IVPB        2 g 200 mL/hr over 30 Minutes Intravenous Every 24 hours 08/26/20 2100 08/31/20 2103   08/27/20 2000  azithromycin (ZITHROMAX) 500 mg in sodium chloride 0.9 % 250 mL IVPB        500 mg 250 mL/hr over 60 Minutes Intravenous Every 24 hours 08/26/20 2100 08/31/20 2159   08/26/20 2015  cefTRIAXone (ROCEPHIN) 1 g in sodium chloride 0.9 % 100 mL IVPB        1 g 200 mL/hr over 30 Minutes Intravenous  Once 08/26/20 2005 08/26/20 2121   08/26/20 2015  azithromycin (ZITHROMAX) 500 mg in sodium chloride 0.9 % 250 mL IVPB        500 mg 250  mL/hr over 60 Minutes Intravenous  Once 08/26/20 2005 08/26/20 2200      Subjective: Patient was seen and examined at bedside.  Overnight events noted.   She is sitting in the chair,  very comfortable but she still has significant pedal edema.  She reports shortness of breath on exertion. Objective: Vitals:   09/02/20 0422 09/02/20 0816 09/02/20 0939 09/02/20 1116  BP: (!) 162/89 (!) 184/82 129/81 105/71  Pulse: 84 69  (!) 57  Resp: 15 15  14   Temp: 98.6 F (37 C) 98.6 F (37 C)  97.8 F (36.6 C)  TempSrc: Oral Oral  Oral  SpO2: 94% 93%  96%  Weight: 100.8 kg     Height:         Intake/Output Summary (Last 24 hours) at 09/02/2020 1538 Last data filed at 09/02/2020 1403 Gross per 24 hour  Intake 1140 ml  Output 1850 ml  Net -710 ml   Filed Weights   08/31/20 0617 09/01/20 0435 09/02/20 0422  Weight: 101.9 kg 99.9 kg 100.8 kg    Examination:  General exam: Appears calm and comfortable. Alert, oriented x1  Respiratory system: basilar crackles on auscultation. Respiratory effort normal. Cardiovascular system: S1 & S2 heard, RRR. No JVD, murmurs, rubs, gallops or clicks. No pedal edema. Gastrointestinal system: Abdomen is nondistended, soft and nontender. No organomegaly or masses felt.  Normal bowel sounds heard. Central nervous system: Alert and oriented. No focal neurological deficits. Extremities: Pitting edema ++, No clubbing, No cyanosis. Back: slight tenderness noted.  Skin: No rashes, lesions or ulcers Psychiatry: Judgement and insight appear normal. Mood & affect appropriate.     Data Reviewed: I have personally reviewed following labs and imaging studies  CBC: Recent Labs  Lab 08/26/20 1725 08/26/20 2055 08/27/20 0452 09/01/20 0206  WBC 10.0 10.4 11.1*  --   NEUTROABS 7.6  --   --   --   HGB 13.3 12.7 13.1 13.5  HCT 42.1 41.3 42.2 44.3  MCV 86.4 86.2 87.6  --   PLT 238 230 210  --    Basic Metabolic Panel: Recent Labs  Lab 08/29/20 0217 08/30/20 0147 08/31/20 0639 09/01/20 0206 09/02/20 0640  NA 139 139 137 139 136  K 3.6 3.9 3.7 3.1* 3.6  CL 95* 96* 94* 94* 95*  CO2 35* 33* 32 34* 31  GLUCOSE 129* 121* 129* 98 130*  BUN 15 15 15 15 12   CREATININE 1.16* 1.06* 1.02* 1.06* 0.97  CALCIUM 8.5* 8.7* 8.9 8.8* 8.7*  MG  --   --  1.6* 1.8 1.8  PHOS  --   --  5.1* 5.0* 4.4   GFR: Estimated Creatinine Clearance: 66.4 mL/min (by C-G formula based on SCr of 0.97 mg/dL). Liver Function Tests: Recent Labs  Lab 08/26/20 1725  AST 18  ALT 22  ALKPHOS 64  BILITOT 0.5  PROT 6.8  ALBUMIN 2.8*   No results for input(s): LIPASE,  AMYLASE in the last 168 hours. No results for input(s): AMMONIA in the last 168 hours. Coagulation Profile: No results for input(s): INR, PROTIME in the last 168 hours. Cardiac Enzymes: No results for input(s): CKTOTAL, CKMB, CKMBINDEX, TROPONINI in the last 168 hours. BNP (last 3 results) No results for input(s): PROBNP in the last 8760 hours. HbA1C: No results for input(s): HGBA1C in the last 72 hours. CBG: Recent Labs  Lab 09/01/20 1212 09/01/20 1639 09/01/20 2124 09/02/20 0617 09/02/20 1127  GLUCAP 172* 104* 175* 143* 155*   Lipid Profile:  No results for input(s): CHOL, HDL, LDLCALC, TRIG, CHOLHDL, LDLDIRECT in the last 72 hours. Thyroid Function Tests: No results for input(s): TSH, T4TOTAL, FREET4, T3FREE, THYROIDAB in the last 72 hours. Anemia Panel: No results for input(s): VITAMINB12, FOLATE, FERRITIN, TIBC, IRON, RETICCTPCT in the last 72 hours. Sepsis Labs: Recent Labs  Lab 08/26/20 2150  PROCALCITON <0.10    Recent Results (from the past 240 hour(s))  SARS Coronavirus 2 by RT PCR (hospital order, performed in [facility] hospital lab) Nasopharyngeal Nasopharyngeal Swab     Status: None   Collection Time: 08/26/20  7:23 PM   Specimen: Nasopharyngeal Swab  Result Value Ref Range Status   SARS Coronavirus 2 NEGATIVE NEGATIVE Final    Comment: (NOTE) SARS-CoV-2 target nucleic acids are NOT DETECTED.  The SARS-CoV-2 RNA is generally detectable in upper and lower respiratory specimens during the acute phase of infection. The lowest concentration of SARS-CoV-2 viral copies this assay can detect is 250 copies / mL. A negative result does not preclude SARS-CoV-2 infection and should not be used as the sole basis for treatment or other patient management decisions.  A negative result may occur with improper specimen collection / handling, submission of specimen other than nasopharyngeal swab, presence of viral mutation(s) within the areas targeted by this assay, and  inadequate number of viral copies (<250 copies / mL). A negative result must be combined with clinical observations, patient history, and epidemiological information.  Fact Sheet for Patients:   BoilerBrush.com.cyhttps://www.fda.gov/media/136312/download  Fact Sheet for Healthcare Providers: https://pope.com/https://www.fda.gov/media/136313/download  This test is not yet approved or  cleared by the [place] FDA and has been authorized for detection and/or diagnosis of SARS-CoV-2 by FDA under an Emergency Use Authorization (EUA).  This EUA will remain in effect (meaning this test can be used) for the duration of the COVID-19 declaration under Section 564(b)(1) of the Act, 21 U.S.C. section 360bbb-3(b)(1), unless the authorization is terminated or revoked sooner.  Performed at [facility], [address] [place], [place] [address]   MRSA PCR Screening     Status: None   Collection Time: 08/28/20  5:56 PM   Specimen: Nasal Mucosa; Nasopharyngeal  Result Value Ref Range Status   MRSA by PCR NEGATIVE NEGATIVE Final    Comment:        The GeneXpert MRSA Assay (FDA approved for NASAL specimens only), is one component of a comprehensive MRSA colonization surveillance program. It is not intended to diagnose MRSA infection nor to guide or monitor treatment for MRSA infections. Performed at [facility], [address] [place], [place] [address]   SARS Coronavirus 2 by RT PCR (hospital order, performed in [facility] hospital lab) Nasopharyngeal Nasopharyngeal Swab     Status: None   Collection Time: 09/01/20 10:39 AM   Specimen: Nasopharyngeal Swab  Result Value Ref Range Status   SARS Coronavirus 2 NEGATIVE NEGATIVE Final    Comment: (NOTE) SARS-CoV-2 target nucleic acids are NOT DETECTED.  The SARS-CoV-2 RNA is generally detectable in upper and lower respiratory specimens during the acute phase of infection. The lowest concentration of SARS-CoV-2 viral copies this assay can detect is  250 copies / mL. A negative result does not preclude SARS-CoV-2 infection and should not be used as the sole basis for treatment or other patient management decisions.  A negative result may occur with improper specimen collection / handling, submission of specimen other than nasopharyngeal swab, presence of viral mutation(s) within the areas targeted by this assay,  and inadequate number of viral copies (<250 copies / mL). A negative result must be combined with clinical observations, patient history, and epidemiological information.  Fact Sheet for Patients:   BoilerBrush.com.cy  Fact Sheet for Healthcare Providers: https://pope.com/  This test is not yet approved or  cleared by the [place] FDA and has been authorized for detection and/or diagnosis of SARS-CoV-2 by FDA under an Emergency Use Authorization (EUA).  This EUA will remain in effect (meaning this test can be used) for the duration of the COVID-19 declaration under Section 564(b)(1) of the Act, 21 U.S.C. section 360bbb-3(b)(1), unless the authorization is terminated or revoked sooner.  Performed at [facility], [address], [place], [place] [address]      Radiology Studies: No results found.  Scheduled Meds: . aspirin EC  81 mg Oral Daily  . citalopram  20 mg Oral QHS  . enoxaparin (LOVENOX) injection  40 mg Subcutaneous Q24H  . furosemide  40 mg Intravenous Once  . hydrALAZINE  5 mg Intravenous Once  . insulin aspart  0-9 Units Subcutaneous TID WC  . isosorbide mononitrate  30 mg Oral Daily  . lisinopril  20 mg Oral Daily  . melatonin  10 mg Oral QHS  . metoprolol tartrate  25 mg Oral BID  . pravastatin  40 mg Oral q1800  . risperiDONE  0.5 mg Oral BID  . ticagrelor  90 mg Oral BID  . traZODone  75 mg Oral QHS   Continuous Infusions:    LOS: 7 days    Time spent:  25 mins.    Cipriano Bunker, MD [facility]   If 7PM-7AM,  please contact night-coverage

## 2020-09-02 NOTE — TOC Progression Note (Signed)
Transition of Care [facility]) - Progression Note    Patient Details  Name: Jo Hays MRN: [id number] Date of Birth: September 29, 1956  Transition [facility]) CM/SW Contact  Eduard Roux, [place] Phone Number: 09/02/2020, 12:34 PM  Clinical Narrative:     [facility], informed patient will not discharge today, anticipate possible discharge tomorrow.  CSW will fax updated clinicals and  HH orders once provided as requested.  Antony Blackbird, MSW, LCSWA Clinical Social Worker   Expected Discharge Plan: Group Home Barriers to Discharge:  (group home unable to admit today, can admit tomorrow, PT/OT evals)  Expected Discharge Plan and Services Expected Discharge Plan: Group Home In-house Referral: Clinical Social Work     Living arrangements for the past 2 months: Group Home                                       Social Determinants of Health (SDOH) Interventions    Readmission Risk Interventions No flowsheet data found.

## 2020-09-02 NOTE — Progress Notes (Signed)
Physical Therapy Evaluation Patient Details Name: Jo Hays MRN: [id number] DOB: 1956/12/03 Today's Date: 09/02/2020   History of Present Illness  64 y.o. female  with a hx of NSTEMI 04/2018 s/p DES RCA & LAD w/ PTCA D1, HTN, HLD, RBBB, Dementia, who is being seen for the evaluation of CHF.  Clinical Impression  Pt is likely close to baseline functioning and should be safe at home with available assist in the group home environment. There are no further acute PT needs, but pt will need portable O2 for a short period and could benefit in the short term from a RW until she gets her confidence back.  Will sign off at this time.     Follow Up Recommendations No PT follow up    Equipment Recommendations  Rolling walker with 5" wheels    Recommendations for Other Services       Precautions / Restrictions Precautions Precautions: Fall Restrictions Weight Bearing Restrictions: No Other Position/Activity Restrictions: monitor BP      Mobility  Bed Mobility Overal bed mobility: Modified Independent             General bed mobility comments: without side rails she struggle to get higher in the bed, but manages given time.  Transfers Overall transfer level: Independent                  Ambulation/Gait Ambulation/Gait assistance: Supervision Gait Distance (Feet): 15 Feet (to bathroom, 180 feet x1 w/o O2 and 50 feet with O2) Assistive device: Rolling walker (2 wheeled) Gait Pattern/deviations: Step-through pattern   Gait velocity interpretation: <1.8 ft/sec, indicate of risk for recurrent falls General Gait Details: Uses the RW generally well.  Cued for safe positioning in the RW.  Generally steady.  SpO2  dropped to 77% on RA.  Pt was coached in stand to get sats back to 90%.  Sats dropped with consistent coaching to 87% on the return 90 feet.  On an additional trial of gait with 2L O2, pt's SpO2 was maintained at 95%.  EHR manageable from 80's to 90's overall during  gait.  Pt expressed that she was tired.  Stairs            Wheelchair Mobility    Modified Rankin (Stroke Patients Only)       Balance Overall balance assessment: No apparent balance deficits (not formally assessed)                                           Pertinent Vitals/Pain Pain Assessment: No/denies pain    Home Living Family/patient expects to be discharged to:: Group home                      Prior Function Level of Independence: Needs assistance   Gait / Transfers Assistance Needed: largely independent - did not use an AD  ADL's / Homemaking Assistance Needed: Independent with ADL's.  Does not assist with cooking or cleaning.  Meds are managed by group home.        Hand Dominance   Dominant Hand: Right    Extremity/Trunk Assessment   Upper Extremity Assessment Upper Extremity Assessment: Overall WFL for tasks assessed    Lower Extremity Assessment Lower Extremity Assessment: Overall WFL for tasks assessed (mild weaknesses proximal, large muscle groups)    Cervical / Trunk Assessment Cervical / Trunk Assessment: Normal  Communication   Communication: No difficulties  Cognition Arousal/Alertness: Awake/alert Behavior During Therapy: WFL for tasks assessed/performed Overall Cognitive Status: History of cognitive impairments - at baseline                                        General Comments General comments (skin integrity, edema, etc.): see O2 qualification in progress notes.    Exercises     Assessment/Plan    PT Assessment Patent does not need any further PT services  PT Problem List         PT Treatment Interventions      PT Goals (Current goals can be found in the Care Plan section)  Acute Rehab PT Goals Patient Stated Goal: Hoping to go back to her group home PT Goal Formulation: All assessment and education complete, DC therapy    Frequency     Barriers to discharge         Co-evaluation               AM-PAC PT "6 Clicks" Mobility  Outcome Measure Help needed turning from your back to your side while in a flat bed without using bedrails?: None Help needed moving from lying on your back to sitting on the side of a flat bed without using bedrails?: None Help needed moving to and from a bed to a chair (including a wheelchair)?: None Help needed standing up from a chair using your arms (e.g., wheelchair or bedside chair)?: None Help needed to walk in hospital room?: None Help needed climbing 3-5 steps with a railing? : A Little 6 Click Score: 23    End of Session   Activity Tolerance: Patient tolerated treatment well Patient left: with call bell/phone within reach;in bed;with bed alarm set Nurse Communication: Mobility status PT Visit Diagnosis: Other abnormalities of gait and mobility (R26.89);Difficulty in walking, not elsewhere classified (R26.2)    Time: 1020-1040 PT Time Calculation (min) (ACUTE ONLY): 20 min   Charges:   PT Evaluation $PT Eval Low Complexity: 1 Low          09/02/2020  Jacinto Halim., PT Acute Rehabilitation Services [phone]  (pager) [phone]  (office)  Jo Hays Jo Hays 09/02/2020, 12:58 PM

## 2020-09-03 LAB — BASIC METABOLIC PANEL
Anion gap: 10 (ref 5–15)
BUN: 16 mg/dL (ref 8–23)
CO2: 34 mmol/L — ABNORMAL HIGH (ref 22–32)
Calcium: 9 mg/dL (ref 8.9–10.3)
Chloride: 95 mmol/L — ABNORMAL LOW (ref 98–111)
Creatinine, Ser: 1.19 mg/dL — ABNORMAL HIGH (ref 0.44–1.00)
GFR calc Af Amer: 56 mL/min — ABNORMAL LOW (ref >60)
GFR calc non Af Amer: 48 mL/min — ABNORMAL LOW (ref >60)
Glucose, Bld: 143 mg/dL — ABNORMAL HIGH (ref 70–99)
Potassium: 3.8 mmol/L (ref 3.5–5.1)
Sodium: 139 mmol/L (ref 135–145)

## 2020-09-03 LAB — GLUCOSE, CAPILLARY
Glucose-Capillary: 137 mg/dL — ABNORMAL HIGH (ref 70–99)
Glucose-Capillary: 246 mg/dL — ABNORMAL HIGH (ref 70–99)
Glucose-Capillary: 82 mg/dL (ref 70–99)

## 2020-09-03 MED ORDER — AMLODIPINE BESYLATE 5 MG PO TABS
5.0000 mg | ORAL_TABLET | Freq: Every day | ORAL | Status: DC
  Administered 2020-09-03: 5 mg via ORAL
  Filled 2020-09-03: qty 1

## 2020-09-03 MED ORDER — FUROSEMIDE 40 MG PO TABS: 40.0000 mg | ORAL_TABLET | Freq: Two times a day (BID) | ORAL | Status: AC

## 2020-09-03 MED ORDER — ISOSORBIDE MONONITRATE ER 30 MG PO TB24: 30.0000 mg | ORAL_TABLET | Freq: Every day | ORAL | 0 refills | Status: AC

## 2020-09-03 MED ORDER — AMLODIPINE BESYLATE 5 MG PO TABS: 5.0000 mg | ORAL_TABLET | Freq: Every day | ORAL | 1 refills | Status: AC

## 2020-09-03 MED ORDER — HYDRALAZINE HCL 25 MG PO TABS: 25.0000 mg | ORAL_TABLET | Freq: Three times a day (TID) | ORAL | Status: DC

## 2020-09-03 MED ORDER — POTASSIUM CHLORIDE CRYS ER 20 MEQ PO TBCR
40.0000 meq | EXTENDED_RELEASE_TABLET | Freq: Once | ORAL | Status: AC
  Administered 2020-09-03: 40 meq via ORAL
  Filled 2020-09-03: qty 2

## 2020-09-03 MED ORDER — FUROSEMIDE 40 MG PO TABS
40.0000 mg | ORAL_TABLET | Freq: Two times a day (BID) | ORAL | Status: DC
  Administered 2020-09-03 (×2): 40 mg via ORAL
  Filled 2020-09-03 (×2): qty 1

## 2020-09-03 MED FILL — AMLODIPINE BESYLATE 5 MG TA: 5 | 30 days supply | Qty: 30 | Fill #0

## 2020-09-03 MED FILL — FUROSEMIDE 40 MG TABLET: 40 | 30 days supply | Qty: 60 | Fill #0

## 2020-09-03 MED FILL — ISOSORBIDE MN ER 30 MG TAB: 30 | 30 days supply | Qty: 30 | Fill #0

## 2020-09-03 NOTE — Progress Notes (Signed)
Pt resting quietly in bed. TV on. VSS. No distress noted. Pt awaiting ride from [facility]AR to take to group home.

## 2020-09-03 NOTE — Progress Notes (Signed)
Pt legal guardian Joni Reining weeks returned called and given information regarding pt discharge   Everlean Cherry, RN

## 2020-09-03 NOTE — Progress Notes (Signed)
[facility] notified [name] information on discharge information. Legal Guardian [name] called and went to voicemail I did leave a message. Waiting for transport to facility.   Everlean Cherry, RN

## 2020-09-03 NOTE — Discharge Summary (Signed)
Physician Discharge Summary  Jo Hays WUJ:[id number] DOB: [birth year] DOA: 08/26/2020  PCP: Patient, No Pcp Per  Admit date: 08/26/2020.  Discharge date: 09/03/2020.  Admitted From: Group home. Disposition: [facility] group home.  Recommendations for Outpatient Follow-up:  1. Follow up with PCP in 1-2 weeks. 2. Please obtain BMP/CBC in one week. 3. Patient has been discharged on Brilinta monotherapy. Aspirin has been discontinued as per cardiology. 4. Advised to take Lasix 40 mg twice daily for diuresis for CHF. 5. Advised to follow-up with cardiology as scheduled.  Home Health None. Equipment/Devices: Home oxygen 2 L/min, walker Rollator  Discharge Condition: Stable CODE STATUS:Full code Diet recommendation: Heart Healthy  Brief Summary: 64 y.o.Femalewith PMH of  obesity, congenital cognitive delay, DM, HTN and CAD who presented with chest pain. In the ER, she was found to have BP 200 systolic, CXR with bilateral infiltrates, BNP elevation, and was started on diuretics and admitted for Acute on chronic CHF exacerbation.  Hospital course: She was admitted for Acute on chronic diastolic CHF exacerbation, she was also treated for community acquired pneumonia, completed antibiotics for 6 days. She was aggressively diuresed, She is 7.9 L negative balance so far she is improving and well diuresed with lasix.  HTN has improved. Patient had 2.47sec pause on tele, but she remains asymptomatic, EKG, sinus bradycardia, metoprolol kept on hold briefly and now resumed.  Patient cleared from cardiology to be discharged.  Cardiology wants to continue Brilinta as monotherapy,  aspirin was discontinued.  Patient is being discharged on Lasix 40 mg twice daily.  Patient will follow up cardiology in 1 to 2 weeks.  Patient is being discharged to [facility] group home.  She was managed for below problems.  Discharge Diagnoses:  Principal Problem:   Unstable angina (HCC) Active  Problems:   Acute respiratory failure (HCC)   CAD (coronary artery disease)   Hypertensive urgency   Acute on chronic diastolic CHF/ Hypertension with hypetensive urgency:  - Patient is diuresed for a total of 7.9 L so far, creatinine improving. - Echo 9/21: LVEF 60-65%, grade II Diastolic dysfunction. -ContinueFurosemide IV, switched to PO lasix today -K supplementation. -Strict I/Os, daily weights, telemetry  -Daily monitoring renal function. -Cardiology recommended iv diuresis, discontinue aspirin and continue Brilanta.   Possible community-acquired pneumonia. -Completed  azithromycin and ceftriaxone for 5 days, - She is on 2 L supplemental oxygen saturating 94%.  - wean O2 as tolerated.  Coronary disease, secondary prevention ACS ruled out.   Presented with chest pain which is resolved -Cardio recommended, DC aspirin , continue  Brilinta. -metoprolol held briefly for bradycardia, pauses on tele,  - Metoprolol resumed 9/20 , moniter HR   -Continue Imdur, pravastatin  Developmental delay Depression: -Continue risperidone, citalopram.  Chronic kidney disease stage IIIa Creatinine stable,  at baseline.  Morbidobesity BMI 40.1    Discharge Instructions  Discharge Instructions    Call MD for:  difficulty breathing, headache or visual disturbances   Complete by: As directed    Call MD for:  persistant dizziness or light-headedness   Complete by: As directed    Call MD for:  persistant nausea and vomiting   Complete by: As directed    Diet - low sodium heart healthy   Complete by: As directed    Diet Carb Modified   Complete by: As directed    Discharge instructions   Complete by: As directed    Advised to follow-up with primary care physician in 1 week. Patient has  been discharged on Brilinta monotherapy. Aspirin has been discontinued as per cardiology. Advised to take Lasix 40 mg twice daily for diuresis for CHF. Advised to follow-up with cardiology  as scheduled.   Increase activity slowly   Complete by: As directed      Allergies as of 09/03/2020   No Known Allergies     Medication List    STOP taking these medications   aspirin EC 81 MG tablet   meloxicam 15 MG tablet Commonly known as: MOBIC     TAKE these medications   acetaminophen 325 MG tablet Commonly known as: TYLENOL Take 650 mg by mouth every 6 (six) hours as needed for moderate pain.   amLODipine 5 MG tablet Commonly known as: NORVASC Take 1 tablet (5 mg total) by mouth daily. Start taking on: September 04, 2020   citalopram 20 MG tablet Commonly known as: CELEXA Take 20 mg by mouth at bedtime.   furosemide 40 MG tablet Commonly known as: LASIX Take 1 tablet (40 mg total) by mouth 2 (two) times daily.   isosorbide mononitrate 30 MG 24 hr tablet Commonly known as: IMDUR Take 1 tablet (30 mg total) by mouth daily. Start taking on: September 04, 2020   lisinopril 20 MG tablet Commonly known as: ZESTRIL Take 20 mg by mouth daily.   Melatonin 10 MG Tabs Take 10 mg by mouth at bedtime.   metoprolol tartrate 25 MG tablet Commonly known as: LOPRESSOR Take 25 mg by mouth 2 (two) times daily.   pravastatin 40 MG tablet Commonly known as: PRAVACHOL Take 40 mg by mouth daily.   risperiDONE 0.5 MG tablet Commonly known as: RISPERDAL Take 0.5 mg by mouth 2 (two) times daily.   sitaGLIPtin 25 MG tablet Commonly known as: JANUVIA Take 25 mg by mouth daily.   ticagrelor 90 MG Tabs tablet Commonly known as: BRILINTA Take 90 mg by mouth 2 (two) times daily.   traZODone 50 MG tablet Commonly known as: DESYREL Take 75 mg by mouth at bedtime.            Durable Medical Equipment  (From admission, onward)         Start     Ordered   09/02/20 1431  For home use only DME oxygen  Once       Question Answer Comment  Length of Need Lifetime   Mode or (Route) Nasal cannula   Liters per Minute 2   Frequency Continuous (stationary and portable  oxygen unit needed)   Oxygen conserving device Yes   Oxygen delivery system Gas      09/02/20 1431   09/02/20 1431  For home use only DME Walker rolling  Once       Question Answer Comment  Walker: With 5 Inch Wheels   Patient needs a walker to treat with the following condition Weakness      09/02/20 1431          Follow-up Information    Corky Crafts, MD Follow up.   Specialties: Cardiology, Radiology, Interventional Cardiology Contact information: [address] [place] [employer] [place] [address] [phone]              No Known Allergies  Consultations:  Cardiology   Procedures/Studies: DG Chest 2 View  Result Date: 08/26/2020 CLINICAL DATA:  Chest pain, productive cough EXAM: CHEST - 2 VIEW COMPARISON:  04/17/2018 FINDINGS: Lung volumes are small. Focal interstitial infiltrate is seen within the lingula. No pneumothorax or pleural  effusion. Cardiac size is within normal limits when accounting for poor pulmonary insufflation. No acute bone abnormality., IMPRESSION: Focal lingular infiltrate, likely infectious in the appropriate clinical setting. Electronically Signed   By: Helyn Numbers MD   On: 08/26/2020 18:04   ECHOCARDIOGRAM COMPLETE  Result Date: 08/27/2020    ECHOCARDIOGRAM REPORT   Patient Name:   AUSHA SIEH Date of Exam: 08/27/2020 Medical Rec #:  [id number]      Height:       63.0 in Accession #:    [id number]     Weight:       244.7 lb Date of Birth:  1956/01/11      BSA:          2.107 m Patient Age:    64 years       BP:           136/74 mmHg Patient Gender: F              HR:           64 bpm. Exam Location:  Inpatient Procedure: 2D Echo, Color Doppler, Cardiac Doppler and Intracardiac            Opacification Agent Indications:    R06.9 DOE  History:        Patient has prior history of Echocardiogram examinations, most                 recent 04/18/2018. CAD; Risk Factors:Hypertension and                 Dyslipidemia. Prior performed at  [facility].  Sonographer:    Irving Burton Senior RDCS Referring Phys: [phone] Eduard Clos  Sonographer Comments: Technically difficult study due to patient body habitus. IMPRESSIONS  1. Technically difficult echo with poor image quality. the valves were not visualized well.  2. Left ventricular ejection fraction, by estimation, is 60 to 65%. The left ventricle has normal function. The left ventricle has no regional wall motion abnormalities. Left ventricular diastolic parameters are consistent with Grade II diastolic dysfunction (pseudonormalization).  3. Right ventricular systolic function was not well visualized. The right ventricular size is not well visualized.  4. Right atrial size was mildly dilated.  5. The mitral valve was not well visualized. No evidence of mitral valve regurgitation.  6. The aortic valve was not well visualized. Aortic valve regurgitation is not visualized. FINDINGS  Left Ventricle: Left ventricular ejection fraction, by estimation, is 60 to 65%. The left ventricle has normal function. The left ventricle has no regional wall motion abnormalities. Definity contrast agent was given IV to delineate the left ventricular  endocardial borders. The left ventricular internal cavity size was normal in size. There is no left ventricular hypertrophy. Left ventricular diastolic parameters are consistent with Grade II diastolic dysfunction (pseudonormalization). Right Ventricle: The right ventricular size is not well visualized. Right vetricular wall thickness was not well visualized. Right ventricular systolic function was not well visualized. Left Atrium: Left atrial size was normal in size. Right Atrium: Right atrial size was mildly dilated. Pericardium: There is no evidence of pericardial effusion. Mitral Valve: The mitral valve was not well visualized. No evidence of mitral valve regurgitation. Tricuspid Valve: The tricuspid valve is not well visualized. Tricuspid valve regurgitation is not  demonstrated. Aortic Valve: The aortic valve was not well visualized. Aortic valve regurgitation is not visualized. Pulmonic Valve: The pulmonic valve was not well visualized. Pulmonic valve regurgitation is not visualized. Aorta: The aortic root and  ascending aorta are structurally normal, with no evidence of dilitation. IAS/Shunts: The atrial septum is grossly normal. Additional Comments: Technically difficult echo with poor image quality. the valves were not visualized well.  LEFT VENTRICLE PLAX 2D LVIDd:         5.20 cm  Diastology LVIDs:         4.50 cm  LV e' medial:    4.90 cm/s LV PW:         1.20 cm  LV E/e' medial:  21.4 LV IVS:        1.10 cm  LV e' lateral:   6.64 cm/s LVOT diam:     2.00 cm  LV E/e' lateral: 15.8 LV SV:         79 LV SV Index:   37 LVOT Area:     3.14 cm  RIGHT VENTRICLE RV S prime:     9.46 cm/s TAPSE (M-mode): 2.2 cm LEFT ATRIUM             Index       RIGHT ATRIUM           Index LA diam:        3.40 cm 1.61 cm/m  RA Area:     19.20 cm LA Vol (A2C):   40.0 ml 18.99 ml/m RA Volume:   58.00 ml  27.53 ml/m LA Vol (A4C):   54.9 ml 26.06 ml/m LA Biplane Vol: 50.0 ml 23.73 ml/m  AORTIC VALVE LVOT Vmax:   110.00 cm/s LVOT Vmean:  78.000 cm/s LVOT VTI:    0.250 m  AORTA Ao Root diam: 3.30 cm MV E velocity: 105.00 cm/s MV A velocity: 62.10 cm/s   SHUNTS MV E/A ratio:  1.69         Systemic VTI:  0.25 m                             Systemic Diam: 2.00 cm Kristeen Miss MD Electronically signed by Kristeen Miss MD Signature Date/Time: 08/27/2020/2:15:11 PM    Final     (Echo, Carotid, EGD, Colonoscopy, ERCP)    Subjective: Patient was seen and examined at bedside.  Overnight events noted.  Patient reports having right hip pain.  She denies any difficulty breathing. She is on 2 L supplemental oxygen saturating 94%.  Pedal edema has significantly improved.  Discharge Exam: Vitals:   09/03/20 1112 09/03/20 1120  BP: 115/70 133/68  Pulse:  (!) 58  Resp:  20  Temp:  97.7 F (36.5  C)  SpO2:  94%   Vitals:   09/03/20 0956 09/03/20 1004 09/03/20 1112 09/03/20 1120  BP: (!) 141/82 (!) 141/82 115/70 133/68  Pulse:  76  (!) 58  Resp:  20  20  Temp:  98 F (36.7 C)  97.7 F (36.5 C)  TempSrc:  Oral  Oral  SpO2:    94%  Weight:      Height:        General: Pt is alert, awake, not in acute distress Cardiovascular: RRR, S1/S2 +, no rubs, no gallops Respiratory: CTA bilaterally, no wheezing, no rhonchi Abdominal: Soft, NT, ND, bowel sounds + Extremities: no cyanosis. Pitting edema+    The results of significant diagnostics from this hospitalization (including imaging, microbiology, ancillary and laboratory) are listed below for reference.     Microbiology: Recent Results (from the past 240 hour(s))  SARS Coronavirus 2 by RT PCR (hospital order, performed in  [facility] hospital lab) Nasopharyngeal Nasopharyngeal Swab     Status: None   Collection Time: 08/26/20  7:23 PM   Specimen: Nasopharyngeal Swab  Result Value Ref Range Status   SARS Coronavirus 2 NEGATIVE NEGATIVE Final    Comment: (NOTE) SARS-CoV-2 target nucleic acids are NOT DETECTED.  The SARS-CoV-2 RNA is generally detectable in upper and lower respiratory specimens during the acute phase of infection. The lowest concentration of SARS-CoV-2 viral copies this assay can detect is 250 copies / mL. A negative result does not preclude SARS-CoV-2 infection and should not be used as the sole basis for treatment or other patient management decisions.  A negative result may occur with improper specimen collection / handling, submission of specimen other than nasopharyngeal swab, presence of viral mutation(s) within the areas targeted by this assay, and inadequate number of viral copies (<250 copies / mL). A negative result must be combined with clinical observations, patient history, and epidemiological information.  Fact Sheet for Patients:   BoilerBrush.com.cy  Fact Sheet  for Healthcare Providers: https://pope.com/  This test is not yet approved or  cleared by the [place] FDA and has been authorized for detection and/or diagnosis of SARS-CoV-2 by FDA under an Emergency Use Authorization (EUA).  This EUA will remain in effect (meaning this test can be used) for the duration of the COVID-19 declaration under Section 564(b)(1) of the Act, 21 U.S.C. section 360bbb-3(b)(1), unless the authorization is terminated or revoked sooner.  Performed at [facility], [address], [place], [place] [address]   MRSA PCR Screening     Status: None   Collection Time: 08/28/20  5:56 PM   Specimen: Nasal Mucosa; Nasopharyngeal  Result Value Ref Range Status   MRSA by PCR NEGATIVE NEGATIVE Final    Comment:        The GeneXpert MRSA Assay (FDA approved for NASAL specimens only), is one component of a comprehensive MRSA colonization surveillance program. It is not intended to diagnose MRSA infection nor to guide or monitor treatment for MRSA infections. Performed at [facility], [address], [place], [place] [address]   SARS Coronavirus 2 by RT PCR (hospital order, performed in [facility] hospital lab) Nasopharyngeal Nasopharyngeal Swab     Status: None   Collection Time: 09/01/20 10:39 AM   Specimen: Nasopharyngeal Swab  Result Value Ref Range Status   SARS Coronavirus 2 NEGATIVE NEGATIVE Final    Comment: (NOTE) SARS-CoV-2 target nucleic acids are NOT DETECTED.  The SARS-CoV-2 RNA is generally detectable in upper and lower respiratory specimens during the acute phase of infection. The lowest concentration of SARS-CoV-2 viral copies this assay can detect is 250 copies / mL. A negative result does not preclude SARS-CoV-2 infection and should not be used as the sole basis for treatment or other patient management decisions.  A negative result may occur with improper specimen collection / handling, submission of  specimen other than nasopharyngeal swab, presence of viral mutation(s) within the areas targeted by this assay, and inadequate number of viral copies (<250 copies / mL). A negative result must be combined with clinical observations, patient history, and epidemiological information.  Fact Sheet for Patients:   BoilerBrush.com.cy  Fact Sheet for Healthcare Providers: https://pope.com/  This test is not yet approved or  cleared by the [place] FDA and has been authorized for detection and/or diagnosis of SARS-CoV-2 by FDA under an Emergency Use Authorization (EUA).  This EUA will remain in effect (meaning this  test can be used) for the duration of the COVID-19 declaration under Section 564(b)(1) of the Act, 21 U.S.C. section 360bbb-3(b)(1), unless the authorization is terminated or revoked sooner.  Performed at [facility], [address] [place], [place] [id number]      Labs: BNP (last 3 results) Recent Labs    08/26/20 1734  BNP 193.1*   Basic Metabolic Panel: Recent Labs  Lab 08/30/20 0147 08/31/20 0639 09/01/20 0206 09/02/20 0640 09/03/20 0427  NA 139 137 139 136 139  K 3.9 3.7 3.1* 3.6 3.8  CL 96* 94* 94* 95* 95*  CO2 33* 32 34* 31 34*  GLUCOSE 121* 129* 98 130* 143*  BUN 15 15 15 12 16   CREATININE 1.06* 1.02* 1.06* 0.97 1.19*  CALCIUM 8.7* 8.9 8.8* 8.7* 9.0  MG  --  1.6* 1.8 1.8  --   PHOS  --  5.1* 5.0* 4.4  --    Liver Function Tests: No results for input(s): AST, ALT, ALKPHOS, BILITOT, PROT, ALBUMIN in the last 168 hours. No results for input(s): LIPASE, AMYLASE in the last 168 hours. No results for input(s): AMMONIA in the last 168 hours. CBC: Recent Labs  Lab 09/01/20 0206  HGB 13.5  HCT 44.3   Cardiac Enzymes: No results for input(s): CKTOTAL, CKMB, CKMBINDEX, TROPONINI in the last 168 hours. BNP: Invalid input(s): POCBNP CBG: Recent Labs  Lab 09/02/20 1127 09/02/20 1621  09/02/20 2101 09/03/20 0635 09/03/20 1124  GLUCAP 155* 104* 120* 137* 246*   D-Dimer No results for input(s): DDIMER in the last 72 hours. Hgb A1c No results for input(s): HGBA1C in the last 72 hours. Lipid Profile No results for input(s): CHOL, HDL, LDLCALC, TRIG, CHOLHDL, LDLDIRECT in the last 72 hours. Thyroid function studies No results for input(s): TSH, T4TOTAL, T3FREE, THYROIDAB in the last 72 hours.  Invalid input(s): FREET3 Anemia work up No results for input(s): VITAMINB12, FOLATE, FERRITIN, TIBC, IRON, RETICCTPCT in the last 72 hours. Urinalysis    Component Value Date/Time   COLORURINE YELLOW 08/26/2020 2143   APPEARANCEUR CLOUDY (A) 08/26/2020 2143   LABSPEC 1.009 08/26/2020 2143   PHURINE 7.0 08/26/2020 2143   GLUCOSEU 50 (A) 08/26/2020 2143   HGBUR NEGATIVE 08/26/2020 2143   BILIRUBINUR NEGATIVE 08/26/2020 2143   KETONESUR NEGATIVE 08/26/2020 2143   PROTEINUR >=300 (A) 08/26/2020 2143   NITRITE NEGATIVE 08/26/2020 2143   LEUKOCYTESUR NEGATIVE 08/26/2020 2143   Sepsis Labs Invalid input(s): PROCALCITONIN,  WBC,  LACTICIDVEN Microbiology Recent Results (from the past 240 hour(s))  SARS Coronavirus 2 by RT PCR (hospital order, performed in [facility] hospital lab) Nasopharyngeal Nasopharyngeal Swab     Status: None   Collection Time: 08/26/20  7:23 PM   Specimen: Nasopharyngeal Swab  Result Value Ref Range Status   SARS Coronavirus 2 NEGATIVE NEGATIVE Final    Comment: (NOTE) SARS-CoV-2 target nucleic acids are NOT DETECTED.  The SARS-CoV-2 RNA is generally detectable in upper and lower respiratory specimens during the acute phase of infection. The lowest concentration of SARS-CoV-2 viral copies this assay can detect is 250 copies / mL. A negative result does not preclude SARS-CoV-2 infection and should not be used as the sole basis for treatment or other patient management decisions.  A negative result may occur with improper specimen collection /  handling, submission of specimen other than nasopharyngeal swab, presence of viral mutation(s) within the areas targeted by this assay, and inadequate number of viral copies (<250 copies / mL). A negative result must  be combined with clinical observations, patient history, and epidemiological information.  Fact Sheet for Patients:   BoilerBrush.com.cy  Fact Sheet for Healthcare Providers: https://pope.com/  This test is not yet approved or  cleared by the [place] FDA and has been authorized for detection and/or diagnosis of SARS-CoV-2 by FDA under an Emergency Use Authorization (EUA).  This EUA will remain in effect (meaning this test can be used) for the duration of the COVID-19 declaration under Section 564(b)(1) of the Act, 21 U.S.C. section 360bbb-3(b)(1), unless the authorization is terminated or revoked sooner.  Performed at [facility], [address] [place], [place] [address]   MRSA PCR Screening     Status: None   Collection Time: 08/28/20  5:56 PM   Specimen: Nasal Mucosa; Nasopharyngeal  Result Value Ref Range Status   MRSA by PCR NEGATIVE NEGATIVE Final    Comment:        The GeneXpert MRSA Assay (FDA approved for NASAL specimens only), is one component of a comprehensive MRSA colonization surveillance program. It is not intended to diagnose MRSA infection nor to guide or monitor treatment for MRSA infections. Performed at [facility], [address], [place], [place] [address]   SARS Coronavirus 2 by RT PCR (hospital order, performed in [facility] hospital lab) Nasopharyngeal Nasopharyngeal Swab     Status: None   Collection Time: 09/01/20 10:39 AM   Specimen: Nasopharyngeal Swab  Result Value Ref Range Status   SARS Coronavirus 2 NEGATIVE NEGATIVE Final    Comment: (NOTE) SARS-CoV-2 target nucleic acids are NOT DETECTED.  The SARS-CoV-2 RNA is generally detectable in upper and  lower respiratory specimens during the acute phase of infection. The lowest concentration of SARS-CoV-2 viral copies this assay can detect is 250 copies / mL. A negative result does not preclude SARS-CoV-2 infection and should not be used as the sole basis for treatment or other patient management decisions.  A negative result may occur with improper specimen collection / handling, submission of specimen other than nasopharyngeal swab, presence of viral mutation(s) within the areas targeted by this assay, and inadequate number of viral copies (<250 copies / mL). A negative result must be combined with clinical observations, patient history, and epidemiological information.  Fact Sheet for Patients:   BoilerBrush.com.cy  Fact Sheet for Healthcare Providers: https://pope.com/  This test is not yet approved or  cleared by the [place] FDA and has been authorized for detection and/or diagnosis of SARS-CoV-2 by FDA under an Emergency Use Authorization (EUA).  This EUA will remain in effect (meaning this test can be used) for the duration of the COVID-19 declaration under Section 564(b)(1) of the Act, 21 U.S.C. section 360bbb-3(b)(1), unless the authorization is terminated or revoked sooner.  Performed at [facility], [address] [place], [place] [address]      Time coordinating discharge: Over 30 minutes  SIGNED:   Cipriano Bunker, MD  [facility] 09/03/2020, 11:58 AM Pager   If 7PM-7AM, please contact night-coverage [email or web address]

## 2020-09-03 NOTE — Discharge Instructions (Signed)
Advised to follow-up with primary care physician in 1 week. Patient has been discharged on Brilinta monotherapy. Aspirin has been discontinued as per cardiology. Advised to take Lasix 40 mg twice daily for diuresis for CHF. Advised to follow-up with cardiology as scheduled.

## 2020-09-03 NOTE — TOC Transition Note (Signed)
Transition of Care [facility]) - CM/SW Discharge Note   Patient Details  Name: Jo Hays MRN: [id number] Date of Birth: 05/10/1956  Transition of Care [facility]) CM/SW Contact:  Eduard Roux, LCSWA Phone Number: 09/03/2020, 2:31 PM   Clinical Narrative:     Patient will DC to: [facility] DC Date: 09/03/2020 Family Notified: called Group Home and left voice message w/ DSS Guardian [name]  Transport By: Sharin Mons  Per MD patient is ready for discharge. RN, patient, and facility notified of DC. Discharge Summary sent to facility. RN given number for [phone]. Ambulance transport requested for patient.   Clinical Social Worker signing off.  Antony Blackbird, MSW, LCSWA Clinical Social Worker    Final next level of care: Group Home Barriers to Discharge: Barriers Resolved   Patient Goals and CMS Choice        Discharge Placement                Patient to be transferred to facility by: [facility] Name of family member notified: spoke with group home, and let voice message with DSS Guardian Patient and family notified of of transfer: 09/03/20  Discharge Plan and Services In-house Referral: Clinical Social Work              DME Arranged: Dan Humphreys rolling DME Agency: [employer] (oxygen) Date DME Agency Contacted: 09/02/20 Time DME Agency Contacted: 1510 Representative spoke with at DME Agency: Zack            Social Determinants of Health (SDOH) Interventions     Readmission Risk Interventions No flowsheet data found.

## 2020-09-03 NOTE — Progress Notes (Addendum)
Progress Note  Patient Name: Jo Hays Date of Encounter: 09/03/2020  [facility] Cardiologist: Lance Muss, MD   Subjective   Sitting up on the side of the bed eating breakfast.   Inpatient Medications    Scheduled Meds: . aspirin EC  81 mg Oral Daily  . citalopram  20 mg Oral QHS  . enoxaparin (LOVENOX) injection  40 mg Subcutaneous Q24H  . insulin aspart  0-9 Units Subcutaneous TID WC  . isosorbide mononitrate  30 mg Oral Daily  . lisinopril  20 mg Oral Daily  . melatonin  10 mg Oral QHS  . metoprolol tartrate  25 mg Oral BID  . pravastatin  40 mg Oral q1800  . risperiDONE  0.5 mg Oral BID  . ticagrelor  90 mg Oral BID  . traZODone  75 mg Oral QHS   Continuous Infusions:  PRN Meds: acetaminophen, hydrALAZINE, ipratropium-albuterol, ondansetron **OR** ondansetron (ZOFRAN) IV   Vital Signs    Vitals:   09/02/20 2201 09/02/20 2347 09/03/20 0405 09/03/20 0751  BP: (!) 169/84 104/71 (!) 154/67 (!) 155/73  Pulse: (!) 57 (!) 57 73 62  Resp: 19 17 18 19   Temp:  98 F (36.7 C) 98.2 F (36.8 C) 98.8 F (37.1 C)  TempSrc:  Oral Oral Oral  SpO2: 97% 94% 93% 92%  Weight:   99.1 kg   Height:        Intake/Output Summary (Last 24 hours) at 09/03/2020 0925 Last data filed at 09/03/2020 0916 Gross per 24 hour  Intake 480 ml  Output 2400 ml  Net -1920 ml   Last 3 Weights 09/03/2020 09/02/2020 09/01/2020  Weight (lbs) 218 lb 7.6 oz 222 lb 3.6 oz 220 lb 4.8 oz  Weight (kg) 99.1 kg 100.8 kg 99.927 kg      Telemetry    SR rates 50-60, few brief episodes into the mid 40 range - Personally Reviewed  ECG    No new tracing this morning  Physical Exam  Pleasant WF, sitting on the bedside GEN: No acute distress.   Neck: No JVD Cardiac: RRR, no murmurs, rubs, or gallops.  Respiratory: Clear to auscultation bilaterally. GI: Soft, nontender, non-distended  MS: No edema; No deformity. Neuro:  Nonfocal  Psych: Normal affect   Labs    High Sensitivity  Troponin:   Recent Labs  Lab 08/26/20 1725 08/26/20 1923 08/26/20 2055 08/26/20 2300  TROPONINIHS 25* 28* 24* 29*      Chemistry Recent Labs  Lab 09/01/20 0206 09/02/20 0640 09/03/20 0427  NA 139 136 139  K 3.1* 3.6 3.8  CL 94* 95* 95*  CO2 34* 31 34*  GLUCOSE 98 130* 143*  BUN 15 12 16   CREATININE 1.06* 0.97 1.19*  CALCIUM 8.8* 8.7* 9.0  GFRNONAA 55* >60 48*  GFRAA >60 >60 56*  ANIONGAP 11 10 10      Hematology Recent Labs  Lab 09/01/20 0206  HGB 13.5  HCT 44.3    BNPNo results for input(s): BNP, PROBNP in the last 168 hours.   DDimer  Recent Labs  Lab 08/27/20 1316  DDIMER 1.67*     Radiology    No results found.  Cardiac Studies   Echocardiogram 08/27/20:  1. Technically difficult echo with poor image quality. the valves were  not visualized well.  2. Left ventricular ejection fraction, by estimation, is 60 to 65%. The  left ventricle has normal function. The left ventricle has no regional  wall motion abnormalities. Left ventricular diastolic  parameters are  consistent with Grade II diastolic  dysfunction (pseudonormalization).  3. Right ventricular systolic function was not well visualized. The right  ventricular size is not well visualized.  4. Right atrial size was mildly dilated.  5. The mitral valve was not well visualized. No evidence of mitral valve  regurgitation.  6. The aortic valve was not well visualized. Aortic valve regurgitation  is not visualized.    Patient Profile     64 y.o. female with a hx of NSTEMI 04/2018 s/p DES RCA & LAD w/ PTCA D1, HTN, HLD, RBBB,Dementia,who is being seen for the evaluation ofCHF.   Assessment & Plan    1. Acute on chronic diastolic HF: Echocardiogram9/2021withLVEF 60-65%and G2DD -Admission weight at 244lb, now down to 218lbs. She still requires O2, desats into the upper 80s when turned back to 1 L.  -I&O, net negative 9.6L -volume status has much improved, will start lasix 40mg   PO BID this morning  2. History of CAD: Prior DES toRCA, LAD, PTCA of D1 in 04/2018 -hsT on presentation mildly elevated with flat trendnot consistent with ACS -No recurrent chest pain symptoms chest pain improved with bp control and diuresis. -Was on DAPT prior to admission. Per Dr. 05/2018 would plan for Brilinta as monotherapy at discharge.   3. HTN: -elevated this morning. Will add amlodipine 5 mg daily -Continue PTAlisinopril 20mg  ( will not increase with slight bump in Cr) and metoprolol 25mg  BID   4. Possible pneumonia: -Abx per primary team   5. CKD stage III: -Creatinine, with slight increase at 0.97>> 1.19  For questions or updates, please contact [facility] Please consult [email or web address] for contact info under     Signed, Excell Seltzer, NP  09/03/2020, 9:25 AM    Patient seen, examined. Available data reviewed. Agree with findings, assessment, and plan as outlined by , NP. On my exam: Vitals:   09/03/20 0956 09/03/20 1004  BP: (!) 141/82 (!) 141/82  Pulse:  76  Resp:  20  Temp:  98 F (36.7 C)  SpO2:     Pt is alert and oriented, NAD HEENT: normal Neck: JVP - normal Lungs: CTA bilaterally CV: RRR without murmur or gallop Abd: soft, NT, Positive BS, no hepatomegaly Ext: no C/C/E, distal pulses intact and equal Skin: warm/dry no rash  Complaining of hip pain this morning. Volume status markedly improved and patient now appears euvolemic. Agree with med recommendations outlined above. Transition to oral furosemide, add amlodipine 5 mg for better BP control. Stable from CV perspective - will sign off today. Will arrange OP follow-up. Please call if questions.   Laverda Page, M.D. 09/03/2020 10:51 AM

## 2020-09-17 ENCOUNTER — Encounter: Payer: Self-pay | Admitting: Physician Assistant

## 2020-09-18 NOTE — Progress Notes (Deleted)
Cardiology Office Note  Date:  09/18/2020   ID:  Braeley, Buskey 25-Jan-1956, MRN [id number]  PCP:  Patient, No Pcp Per  Cardiologist:  Dr. Eldridge Dace  _____________  Hospital f/u for CHF  _____________   History of Present Illness: Jo Hays is a 64 y.o. female with pmh of NSTEMI 04/2018 s/p DES RCA & LAD w/ PTCA D1, HTN, HLD, RBBB, dementia who is being seen for hospital follow-up for CHF. Prior to admission patient was following with cardiology in [place]. Patient was admitted 9/14-9/22 for acute CHF and PNA, came from group home. The patient was discharged on lasix 40mg  BID. DAPT was changed to monotherapy with Brilinta (Aspirin was discontinued).   Today, she denies symptoms of palpitations, chest pain, shortness of breath, orthopnea, PND, lower extremity edema, claudication, dizziness, presyncope, syncope, bleeding, or neurologic sequela. The patient is tolerating medications without difficulties and is otherwise without complaint today.    _____________   Past Medical History:  Diagnosis Date  . CAD (coronary artery disease)    a. NSTEMI 04/2018 s/p DES RCA & LAD w/ PTCA D1.  . Chronic diastolic CHF (congestive heart failure) (HCC)   . CKD (chronic kidney disease), stage III (HCC)   . Diabetes (HCC) 08/27/2020  . Hyperlipidemia   . Hypertension   . Intellectual disability   . Morbid obesity (HCC)   . NSTEMI (non-ST elevated myocardial infarction) (HCC) 04/2018  . RBBB    Past Surgical History:  Procedure Laterality Date  . CORONARY ANGIOPLASTY WITH STENT PLACEMENT  04/2018   at [facility] by WF, s/p DES RCA and LAD, PTCA Diag1, [facility], NP sees   _____________  Current Outpatient Medications  Medication Sig Dispense Refill  . acetaminophen (TYLENOL) 325 MG tablet Take 650 mg by mouth every 6 (six) hours as needed for moderate pain.    Elissa Hefty amLODipine (NORVASC) 5 MG tablet Take 1 tablet (5 mg total) by mouth daily. 30 tablet 1  . citalopram (CELEXA) 20 MG tablet  Take 20 mg by mouth at bedtime.    . furosemide (LASIX) 40 MG tablet Take 1 tablet (40 mg total) by mouth 2 (two) times daily. 60 tablet 01  . isosorbide mononitrate (IMDUR) 30 MG 24 hr tablet Take 1 tablet (30 mg total) by mouth daily. 30 tablet 0  . lisinopril (ZESTRIL) 20 MG tablet Take 20 mg by mouth daily.    . Melatonin 10 MG TABS Take 10 mg by mouth at bedtime.    . metoprolol tartrate (LOPRESSOR) 25 MG tablet Take 25 mg by mouth 2 (two) times daily.    . pravastatin (PRAVACHOL) 40 MG tablet Take 40 mg by mouth daily.    . risperiDONE (RISPERDAL) 0.5 MG tablet Take 0.5 mg by mouth 2 (two) times daily.    . sitaGLIPtin (JANUVIA) 25 MG tablet Take 25 mg by mouth daily.    . ticagrelor (BRILINTA) 90 MG TABS tablet Take 90 mg by mouth 2 (two) times daily.    . traZODone (DESYREL) 50 MG tablet Take 75 mg by mouth at bedtime.     No current facility-administered medications for this visit.   _____________   Allergies:   Patient has no known allergies.  _____________   Social History:  The patient  reports that she has never smoked. She has quit using smokeless tobacco.  _____________   Family History:  The patient's Family history is unknown by patient.  _____________   ROS:  Please see the history  of present illness.   Positive for ***,   All other systems are reviewed and negative.  _____________   PHYSICAL EXAM: VS:  There were no vitals taken for this visit. , BMI There is no height or weight on file to calculate BMI. GEN: Well nourished, well developed, in no acute distress  HEENT: normal  Neck: no JVD, carotid bruits, or masses Cardiac: RRR; no murmurs, rubs, or gallops. No clubbing, cyanosis, edema.  Radials/DP/PT 2+ and equal bilaterally.  Respiratory:  clear to auscultation bilaterally, normal work of breathing GI: soft, nontender, nondistended, + BS MS: no deformity or atrophy  Skin: warm and dry, no rash Neuro:  Strength and sensation are intact Psych: euthymic  mood, full affect _____________  EKG:   The ekg ordered today shows ***  Recent Labs: 08/26/2020: ALT 22; B Natriuretic Peptide 193.1; TSH 2.413 08/27/2020: Platelets 210 09/01/2020: Hemoglobin 13.5 09/02/2020: Magnesium 1.8 09/03/2020: BUN 16; Creatinine, Ser 1.19; Potassium 3.8; Sodium 139  08/29/2020: Cholesterol 143; HDL 38; LDL Cholesterol 83; Total CHOL/HDL Ratio 3.8; Triglycerides 108; VLDL 22  CrCl cannot be calculated (Unknown ideal weight.).  Wt Readings from Last 3 Encounters:  09/03/20 218 lb 7.6 oz (99.1 kg)    Echocardiogram 08/27/20:  1. Technically difficult echo with poor image quality. the valves were  not visualized well.  2. Left ventricular ejection fraction, by estimation, is 60 to 65%. The  left ventricle has normal function. The left ventricle has no regional  wall motion abnormalities. Left ventricular diastolic parameters are  consistent with Grade II diastolic  dysfunction (pseudonormalization).  3. Right ventricular systolic function was not well visualized. The right  ventricular size is not well visualized.  4. Right atrial size was mildly dilated.  5. The mitral valve was not well visualized. No evidence of mitral valve  regurgitation.  6. The aortic valve was not well visualized. Aortic valve regurgitation  is not visualized.  _____________   ASSESSMENT AND PLAN:  Chronic diastolic CHF - Echo during admission showed preserved EF - He was discharged on lasix 40mg  BID - BMET today  History of CAD - prior DES to RCA, Lad, PTCA of D1 in 04/2018 - No chest pain - PTA was on DAPT. Dr. 05/2018 recommended monotherapy with Brilinta and Aspirin was discontinued  HTN - amlodipine 5mg  added durign admission - PTA lisinopril 20mg  and metoprolol 25mg  BID  CKD stage III - creatinine 1.19 at d/c. Check BMET today   Disposition:   FU with ***   Signed, Adonna Horsley Excell Seltzer, PA-C 09/18/2020 10:45 AM    _____________ [facility] [address] [place] 11/18/2020 [facility]  [phone] (office) [phone] (fax)

## 2020-09-19 ENCOUNTER — Ambulatory Visit: Payer: Medicare HMO | Admitting: Physician Assistant

## 2020-09-19 NOTE — Progress Notes (Deleted)
Cardiology Office Note    Date:  09/19/2020   ID:  Jo, Hays 05-Mar-1956, MRN [id number]  PCP:  Patient, No Pcp Per  Cardiologist:  Lance Muss, MD  Electrophysiologist:  None   Chief Complaint: ***  History of Present Illness:   Jo Hays is a 64 y.o. female with history of CAD with NSTEMI 04/2018 s/p DES RCA & LAD w/ PTCA D1, CKD stage III, DM, chronic diastolic CHF, morbid obesity, HTN, HLD, bradycardia, RBBB, developmental delay, who is here to followup post-hospitalization. She was previusly followed by an outside facility then admitted to [place] in 08/2020 with CHF and hypertensive urgency requiring diuresis. 2D Echo 08/27/20 was poor image quality, EF 60-65%, grade 2 DD, mildly dilated RA otherwise not well visualized. She was also treated for possible PNA. DC weight 99.9kg. Note also mentions that metoprolol was held briefly for bradycardia/pauses on telemetry. ASA was discontinued since she was >1 year from PCI and was continued on Brilinta monotherapy.   Brilinta monotherapy  LDL suboptimal  consider repeat cmet - albumin low   Chronic diastolic CHF  CAD  Bradycardia  Essential HTN  HLD    Labwork independently reviewed: 09/03/20 K 3.8, Cr 1.19, Mg 1.8, LDL 83, trig 108, A1C 8.7, TSH wnl, BNP 193, albumin 2.8     Past Medical History:  Diagnosis Date  . CAD (coronary artery disease)    a. NSTEMI 04/2018 s/p DES RCA & LAD w/ PTCA D1.  . Chronic diastolic CHF (congestive heart failure) (HCC)   . CKD (chronic kidney disease), stage III (HCC)   . Diabetes (HCC) 08/27/2020  . Hyperlipidemia   . Hypertension   . Intellectual disability   . Morbid obesity (HCC)   . NSTEMI (non-ST elevated myocardial infarction) (HCC) 04/2018  . RBBB     Past Surgical History:  Procedure Laterality Date  . CORONARY ANGIOPLASTY WITH STENT PLACEMENT  04/2018   at [facility] by WF, s/p DES RCA and LAD, PTCA Diag1, Elissa Hefty, NP sees    Current Medications: No  outpatient medications have been marked as taking for the 09/19/20 encounter (Appointment) with Laurann Montana, PA-C.   ***   Allergies:   Patient has no known allergies.   Social History   Socioeconomic History  . Marital status: Single    Spouse name: Not on file  . Number of children: Not on file  . Years of education: Not on file  . Highest education level: Not on file  Occupational History  . Not on file  Tobacco Use  . Smoking status: Never Smoker  . Smokeless tobacco: Former Engineer, water and Sexual Activity  . Alcohol use: Not on file  . Drug use: Not on file  . Sexual activity: Not on file  Other Topics Concern  . Not on file  Social History Narrative  . Not on file   Social Determinants of Health   Financial Resource Strain:   . Difficulty of Paying Living Expenses: Not on file  Food Insecurity:   . Worried About Programme researcher, broadcasting/film/video in the Last Year: Not on file  . Ran Out of Food in the Last Year: Not on file  Transportation Needs:   . Lack of Transportation (Medical): Not on file  . Lack of Transportation (Non-Medical): Not on file  Physical Activity:   . Days of Exercise per Week: Not on file  . Minutes of Exercise per Session: Not on file  Stress:   .  Feeling of Stress : Not on file  Social Connections:   . Frequency of Communication with Friends and Family: Not on file  . Frequency of Social Gatherings with Friends and Family: Not on file  . Attends Religious Services: Not on file  . Active Member of Clubs or Organizations: Not on file  . Attends Banker Meetings: Not on file  . Marital Status: Not on file     Family History:  The patient's ***Family history is unknown by patient.  ROS:   Please see the history of present illness. Otherwise, review of systems is positive for ***.  All other systems are reviewed and otherwise negative.    EKGs/Labs/Other Studies Reviewed:    Studies reviewed are outlined and summarized above.  Reports included below if pertinent.  2D echo 08/27/20 IMPRESSIONS    1. Technically difficult echo with poor image quality. the valves were  not visualized well.  2. Left ventricular ejection fraction, by estimation, is 60 to 65%. The  left ventricle has normal function. The left ventricle has no regional  wall motion abnormalities. Left ventricular diastolic parameters are  consistent with Grade II diastolic  dysfunction (pseudonormalization).  3. Right ventricular systolic function was not well visualized. The right  ventricular size is not well visualized.  4. Right atrial size was mildly dilated.  5. The mitral valve was not well visualized. No evidence of mitral valve  regurgitation.  6. The aortic valve was not well visualized. Aortic valve regurgitation  is not visualized.     EKG:  EKG is ordered today, personally reviewed, demonstrating ***  Recent Labs: 08/26/2020: ALT 22; B Natriuretic Peptide 193.1; TSH 2.413 08/27/2020: Platelets 210 09/01/2020: Hemoglobin 13.5 09/02/2020: Magnesium 1.8 09/03/2020: BUN 16; Creatinine, Ser 1.19; Potassium 3.8; Sodium 139  Recent Lipid Panel    Component Value Date/Time   CHOL 143 08/29/2020 0217   TRIG 108 08/29/2020 0217   HDL 38 (L) 08/29/2020 0217   CHOLHDL 3.8 08/29/2020 0217   VLDL 22 08/29/2020 0217   LDLCALC 83 08/29/2020 0217    PHYSICAL EXAM:    VS:  There were no vitals taken for this visit.  BMI: There is no height or weight on file to calculate BMI.  GEN: Well nourished, well developed, in no acute distress HEENT: normocephalic, atraumatic Neck: no JVD, carotid bruits, or masses Cardiac: ***RRR; no murmurs, rubs, or gallops, no edema  Respiratory:  clear to auscultation bilaterally, normal work of breathing GI: soft, nontender, nondistended, + BS MS: no deformity or atrophy Skin: warm and dry, no rash Neuro:  Alert and Oriented x 3, Strength and sensation are intact, follows commands Psych: euthymic mood,  full affect  Wt Readings from Last 3 Encounters:  09/03/20 218 lb 7.6 oz (99.1 kg)     ASSESSMENT & PLAN:   1. ***  Disposition: F/u with ***   Medication Adjustments/Labs and Tests Ordered: Current medicines are reviewed at length with the patient today.  Concerns regarding medicines are outlined above. Medication changes, Labs and Tests ordered today are summarized above and listed in the Patient Instructions accessible in Encounters.   Signed, Laurann Montana, PA-C  09/19/2020 12:47 PM    [facility] [address] [place], [place], [place]  [address] Phone: [phone]; Fax: [phone]

## 2021-02-10 DEATH — deceased

## 2021-04-28 IMAGING — CR DG CHEST 2V
2 series · 2 of 2 positions shown · non-contrast
Comparison: 04/17/2018

CLINICAL DATA: Chest pain, productive cough

EXAM:
CHEST - 2 VIEW

[chest ap]
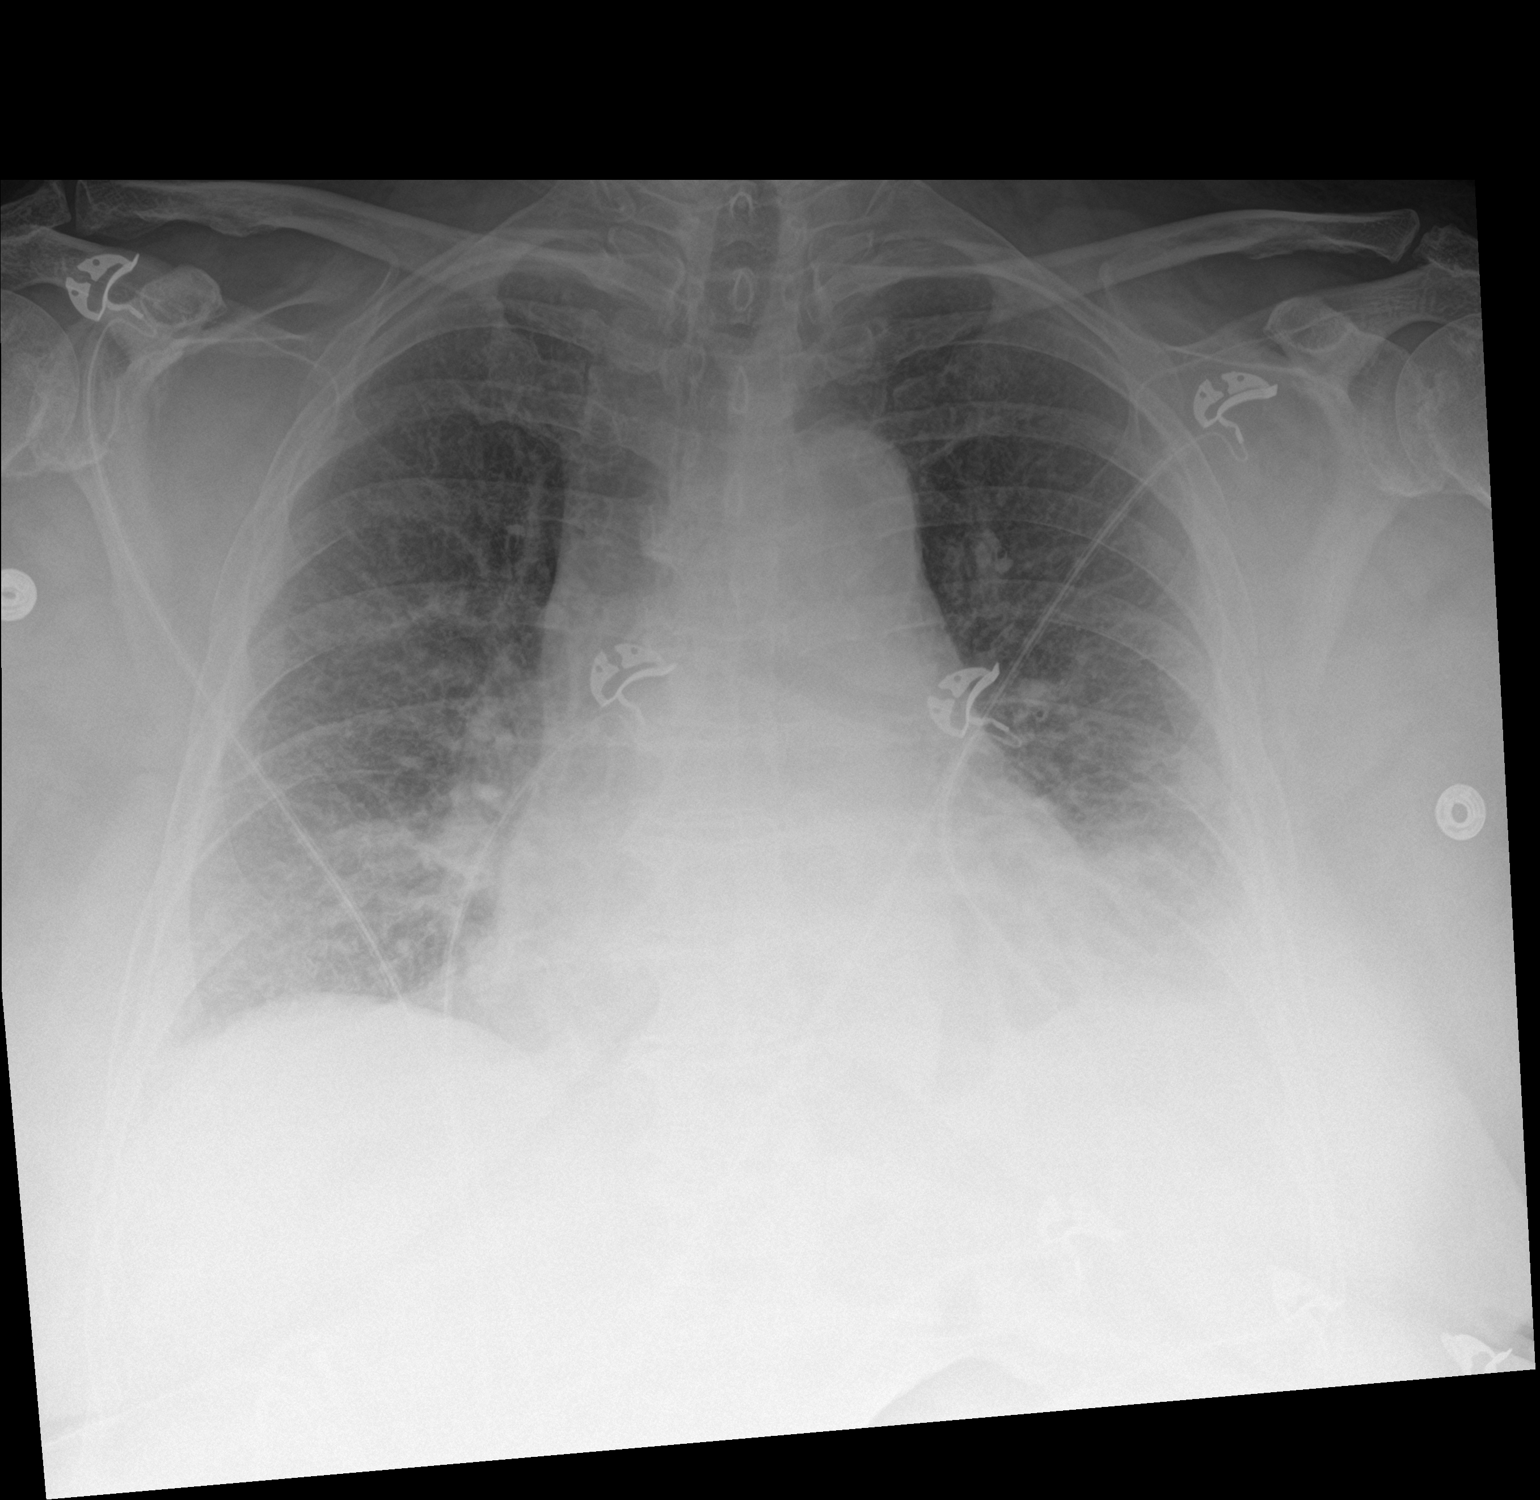

[chest lat]
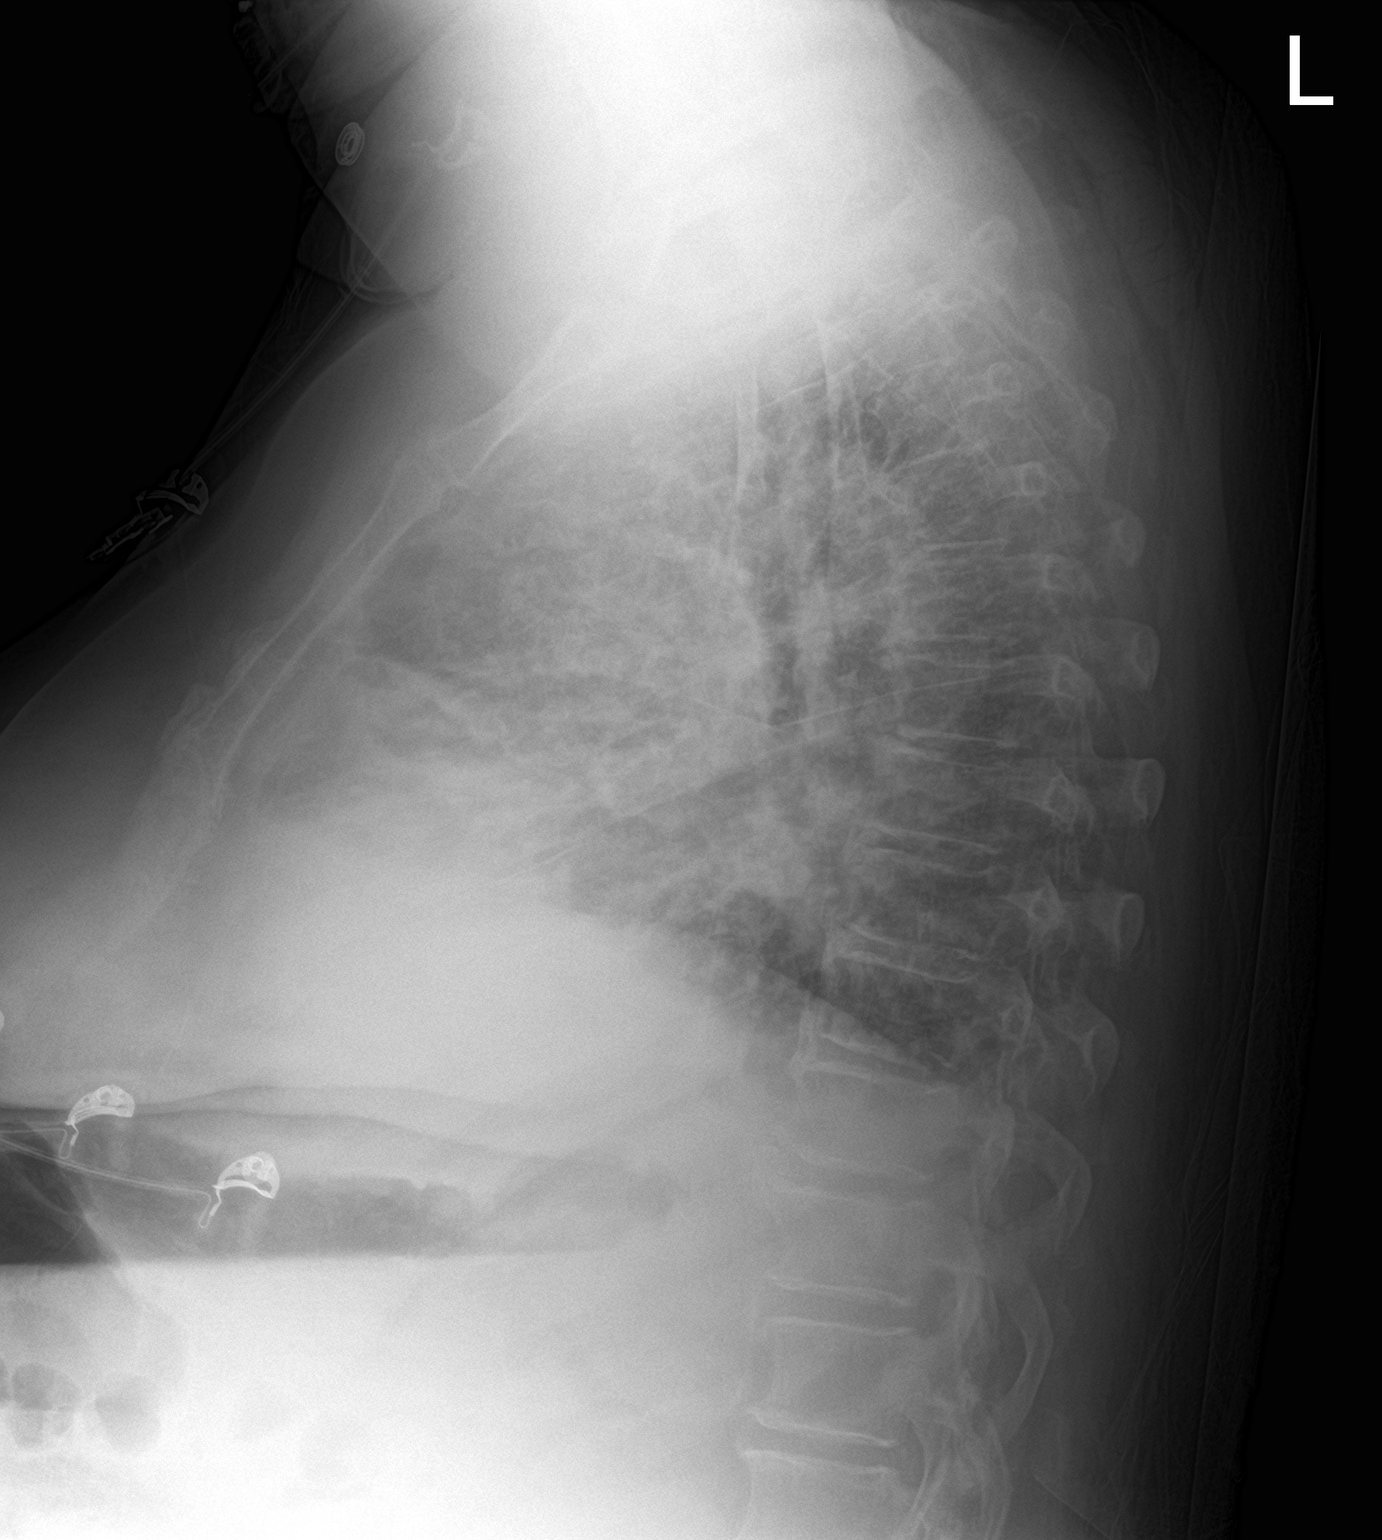

[2 of 2 positions shown; findings below may reference images not displayed]

FINDINGS: Lung volumes are small. Focal interstitial infiltrate is seen within
the lingula. No pneumothorax or pleural effusion. Cardiac size is
within normal limits when accounting for poor pulmonary
insufflation. No acute bone abnormality.,
IMPRESSION: Focal lingular infiltrate, likely infectious in the appropriate
clinical setting.
